# Patient Record
Sex: Male | Born: 1980 | Race: White | Hispanic: No | Marital: Married | Smoking: Former smoker
Health system: Southern US, Community
[De-identification: ages and names within clinical notes are randomized; demographics above are authoritative.]

## PROBLEM LIST (undated history)

## (undated) DIAGNOSIS — G5601 Carpal tunnel syndrome, right upper limb: Secondary | ICD-10-CM

## (undated) HISTORY — PX: VASECTOMY: SHX75

## (undated) HISTORY — PX: WISDOM TOOTH EXTRACTION: SHX21

## (undated) HISTORY — PX: APPENDECTOMY: SHX54

## (undated) HISTORY — PX: TONSILLECTOMY: SUR1361

---

## 1998-05-14 ENCOUNTER — Emergency Department (HOSPITAL_COMMUNITY): Admission: EM | Admit: 1998-05-14 | Discharge: 1998-05-14 | Payer: Self-pay | Admitting: Emergency Medicine

## 1998-05-14 ENCOUNTER — Encounter: Payer: Self-pay | Admitting: Emergency Medicine

## 1998-06-18 ENCOUNTER — Emergency Department (HOSPITAL_COMMUNITY): Admission: EM | Admit: 1998-06-18 | Discharge: 1998-06-18 | Payer: Self-pay | Admitting: Emergency Medicine

## 1998-06-18 ENCOUNTER — Encounter: Payer: Self-pay | Admitting: Emergency Medicine

## 2005-06-02 ENCOUNTER — Emergency Department (HOSPITAL_COMMUNITY): Admission: EM | Admit: 2005-06-02 | Discharge: 2005-06-02 | Payer: Self-pay | Admitting: Emergency Medicine

## 2006-09-17 ENCOUNTER — Emergency Department: Payer: Self-pay | Admitting: Emergency Medicine

## 2006-09-17 ENCOUNTER — Other Ambulatory Visit: Payer: Self-pay

## 2007-09-01 ENCOUNTER — Ambulatory Visit: Payer: Self-pay | Admitting: Family Medicine

## 2007-09-01 DIAGNOSIS — M719 Bursopathy, unspecified: Secondary | ICD-10-CM

## 2007-09-01 DIAGNOSIS — M545 Low back pain, unspecified: Secondary | ICD-10-CM | POA: Insufficient documentation

## 2007-09-01 DIAGNOSIS — M67919 Unspecified disorder of synovium and tendon, unspecified shoulder: Secondary | ICD-10-CM | POA: Insufficient documentation

## 2007-09-05 ENCOUNTER — Ambulatory Visit: Payer: Self-pay | Admitting: Family Medicine

## 2007-09-08 ENCOUNTER — Telehealth: Payer: Self-pay | Admitting: Family Medicine

## 2007-09-12 LAB — CONVERTED CEMR LAB
ALT: 37 units/L (ref 0–53)
AST: 26 units/L (ref 0–37)
Albumin: 4.2 g/dL (ref 3.5–5.2)
Alkaline Phosphatase: 57 units/L (ref 39–117)
BUN: 13 mg/dL (ref 6–23)
Bilirubin, Direct: 0.1 mg/dL (ref 0.0–0.3)
CO2: 30 meq/L (ref 19–32)
Calcium: 9.3 mg/dL (ref 8.4–10.5)
Chloride: 105 meq/L (ref 96–112)
Cholesterol: 149 mg/dL (ref 0–200)
Creatinine, Ser: 1.1 mg/dL (ref 0.4–1.5)
GFR calc Af Amer: 103 mL/min
GFR calc non Af Amer: 85 mL/min
Glucose, Bld: 86 mg/dL (ref 70–99)
HDL: 49.1 mg/dL (ref >39.0)
LDL Cholesterol: 75 mg/dL (ref 0–99)
Potassium: 4.2 meq/L (ref 3.5–5.1)
Sodium: 139 meq/L (ref 135–145)
Total Bilirubin: 0.9 mg/dL (ref 0.3–1.2)
Total CHOL/HDL Ratio: 3
Total Protein: 6.7 g/dL (ref 6.0–8.3)
Triglycerides: 127 mg/dL (ref 0–149)
VLDL: 25 mg/dL (ref 0–40)

## 2007-09-15 ENCOUNTER — Ambulatory Visit: Payer: Self-pay | Admitting: Family Medicine

## 2007-09-15 DIAGNOSIS — K219 Gastro-esophageal reflux disease without esophagitis: Secondary | ICD-10-CM | POA: Insufficient documentation

## 2007-09-25 DIAGNOSIS — F331 Major depressive disorder, recurrent, moderate: Secondary | ICD-10-CM | POA: Insufficient documentation

## 2007-10-12 ENCOUNTER — Telehealth: Payer: Self-pay | Admitting: Family Medicine

## 2007-10-31 ENCOUNTER — Ambulatory Visit: Payer: Self-pay | Admitting: Family Medicine

## 2007-10-31 LAB — CONVERTED CEMR LAB: Rapid Strep: NEGATIVE

## 2007-11-27 ENCOUNTER — Ambulatory Visit: Payer: Self-pay | Admitting: Family Medicine

## 2007-12-28 ENCOUNTER — Ambulatory Visit: Payer: Self-pay | Admitting: Family Medicine

## 2008-03-06 ENCOUNTER — Ambulatory Visit: Payer: Self-pay | Admitting: Family Medicine

## 2008-03-14 ENCOUNTER — Ambulatory Visit: Payer: Self-pay | Admitting: Family Medicine

## 2008-03-22 ENCOUNTER — Telehealth: Payer: Self-pay | Admitting: Family Medicine

## 2008-03-22 ENCOUNTER — Encounter: Payer: Self-pay | Admitting: Family Medicine

## 2009-10-01 ENCOUNTER — Ambulatory Visit: Payer: Self-pay | Admitting: Family Medicine

## 2009-11-07 ENCOUNTER — Telehealth: Payer: Self-pay | Admitting: Family Medicine

## 2010-01-06 ENCOUNTER — Ambulatory Visit: Payer: Self-pay | Admitting: Family Medicine

## 2010-01-06 DIAGNOSIS — R51 Headache: Secondary | ICD-10-CM | POA: Insufficient documentation

## 2010-01-06 DIAGNOSIS — R519 Headache, unspecified: Secondary | ICD-10-CM | POA: Insufficient documentation

## 2010-01-06 DIAGNOSIS — J309 Allergic rhinitis, unspecified: Secondary | ICD-10-CM | POA: Insufficient documentation

## 2010-02-26 ENCOUNTER — Telehealth: Payer: Self-pay | Admitting: Family Medicine

## 2010-03-06 ENCOUNTER — Telehealth: Payer: Self-pay | Admitting: Family Medicine

## 2010-03-11 ENCOUNTER — Encounter: Payer: Self-pay | Admitting: Family Medicine

## 2010-03-11 ENCOUNTER — Telehealth: Payer: Self-pay | Admitting: Family Medicine

## 2010-03-12 ENCOUNTER — Encounter: Payer: Self-pay | Admitting: Family Medicine

## 2010-07-28 NOTE — Assessment & Plan Note (Signed)
Summary: HA/CLE   Vital Signs:  Patient profile:   30 year old male Height:      67.75 inches Weight:      181.8 pounds BMI:     27.95 Temp:     98.5 degrees F oral Pulse rate:   64 / minute Pulse rhythm:   regular BP sitting:   110 / 72  (left arm) Cuff size:   regular  Vitals Entered By: Benny Lennert CMA Duncan Dull) (January 06, 2010 10:21 AM)  History of Present Illness: Chief complaint head ache for 1 month  1 month  frontal headache... intermittant.  Starts in AM... gradually worseing. Nausea, photophonophobia. Funny smells. No numbness, no weakness 8/10 Occ lightheaded. Some runny/congestion... clear nasal discharge. Some sneeze, itchy eyes.  No fever.  Given antibipotics 2-3 months ago and agin 1 month ago...better at that time.. ie less green mucus.   Using fexofenadine... minimal improvement.  Sister: migraine      Problems Prior to Update: 1)  Gerd  (ICD-530.81) 2)  Health Maintenance Exam  (ICD-V70.0) 3)  Allergic Rhinitis Cause Unspecified  (ICD-477.9) 4)  Low Back Pain Syndrome  (ICD-724.2) 5)  Rotator Cuff Syndrome, Left  (ICD-726.10) 6)  Depression  (ICD-311) 7)  Anxiety/anger  (ICD-300.00)  Current Medications (verified): 1)  Multivitamins   Tabs (Multiple Vitamin) .... Take 1 Tablet By Mouth Once A Day 2)  Fluticasone Propionate 50 Mcg/act Susp (Fluticasone Propionate) .... 2 Sprays Per Nostril Daily  Allergies: 1)  ! Pcn 2)  ! Sulfa  Past History:  Past medical, surgical, family and social histories (including risk factors) reviewed, and no changes noted (except as noted below).  Past Medical History: Reviewed history from 09/01/2007 and no changes required. Anxiety Depression  Past Surgical History: Reviewed history from 09/25/2007 and no changes required. Tonsillectomy 8yoa Vasectomy Hetty Ely) 01/2001  Family History: Reviewed history from 09/25/2007 and no changes required. Father A 12s DVTs Mother A 25s depression Brother  A Sister A PGM: DM PGF: prostate cancer MGF: MI at age late 48s  Social History: Reviewed history from 09/01/2007 and no changes required. 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 CV:  Denies fainting and swelling of feet. Resp:  mild shortness of breath due to nose congested. GI:  Denies abdominal pain. GU:  Denies dysuria.  Physical Exam  General:  Well-developed,well-nourished,in no acute distress; alert,appropriate and cooperative throughout examination Head:  no maxillary sinus ttp Eyes:  No corneal or conjunctival inflammation noted. EOMI. Perrla. Funduscopic exam benign, without hemorrhages, exudates or papilledema. Vision grossly normal. Ears:  External ear exam shows no significant lesions or deformities.  Otoscopic examination reveals clear canals, tympanic membranes are intact bilaterally without bulging, retraction, inflammation or discharge. Hearing is grossly normal bilaterally. Nose:  External nasal examination shows no deformity or inflammation. Nasal mucosa are pink and moist without lesions or exudates. Mouth:  Oral mucosa and oropharynx without lesions or exudates.  Teeth in good repair. Neck:  no carotid bruit or thyromegaly no cervical or supraclavicular lymphadenopathy  Lungs:  Normal respiratory effort, chest expands symmetrically. Lungs are clear to auscultation, no crackles or wheezes. Heart:  Normal rate and regular rhythm. S1 and S2 normal without gallop, murmur, click, rub or other extra sounds. Neurologic:  No cranial nerve deficits noted. Station and gait are normal. DTRs are  symmetrical throughout. Sensory, motor and coordinative functions appear intact.   Impression & Recommendations:  Problem # 1:  HEADACHE (ICD-784.0) Possible migrane. info given on  migraine and healthy lifestyle. Keep headhace diary.  Treat allergies as possible trigger as below.  The following medications were removed from the medication list:    Mobic 15 Mg Tabs (Meloxicam) .Marland Kitchen... 1 by mouth once daily with food as needed back pain  Problem # 2:  ALLERGIC RHINITIS (ICD-477.9) Continue allegra, add nasal steroid.  His updated medication list for this problem includes:    Fluticasone Propionate 50 Mcg/act Susp (Fluticasone propionate) .Marland Kitchen... 2 sprays per nostril daily  Complete Medication List: 1)  Multivitamins Tabs (Multiple vitamin) .... Take 1 tablet by mouth once a day 2)  Fluticasone Propionate 50 Mcg/act Susp (Fluticasone propionate) .... 2 sprays per nostril daily  Patient Instructions: 1)  8 hours sleep at night, increase fluids (64 oz water a day), 3 meals a day, regular exercise. 2)   Keep headache diary..Review migraine info. 3)  Continue fexofenadine. 4)  Add nasal steroid. 5)   For headache ibuprofen 800 mg every three hours for headache, or aleve.  6)  Follow up in 3-4 weeks if symptoms not improving.  Prescriptions: FLUTICASONE PROPIONATE 50 MCG/ACT SUSP (FLUTICASONE PROPIONATE) 2 sprays per nostril daily  #1 x 2   Entered and Authorized by:   Kerby Nora MD   Signed by:   Kerby Nora MD on 01/06/2010   Method used:   [facility]        [facility] [facility]       [address]       [place], [place]  [address]       Ph: [phone] or [phone]       Fax: [phone]   RxID:   [id number]   Current Allergies (reviewed today): ! PCN ! SULFA

## 2010-07-28 NOTE — Medication Information (Signed)
Summary: Prior Authorization Request for Singulair  Prior Authorization Request for Singulair   Imported By: Maryln Gottron 03/27/2010 11:21:27  _____________________________________________________________________  External Attachment:    Type:   Image     Comment:   External Document

## 2010-07-28 NOTE — Progress Notes (Signed)
Summary: prior auth needed for singulair  Phone Note From Pharmacy   [facility] [address] [employer] [facility] Call: Prior Berkley Harvey is needed for singulair, form is on your desk.  Initial call taken by: Lowella Petties CMA,  March 11, 2010 8:50 AM
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
## 2010-07-28 NOTE — Progress Notes (Signed)
Summary: Cyclobenzaprine 10mg  rx  Phone Note Refill Request Call back at [phone] Message from:  [facility] on Nov 07, 2009 2:03 PM  Refills Requested: Medication #1:  FLEXERIL 10 MG TABS 1 by mouth up to three times a day as needed back pain warn of sedation. [facility]ill on Flexeril 10mg   No refill date sent.Please advise.    Method Requested: Telephone to Pharmacy Initial call taken by: Lewanda Rife LPN,  Nov 07, 2009 2:04 PM  Follow-up for Phone Call        Is Dr Ermalene Searing here today? Follow-up by: Judith Part MD,  Nov 07, 2009 2:13 PM  Additional Follow-up for Phone Call Additional follow up Details #1::        Refill x 1...but if back pain not improving will need follow up appt .  Additional Follow-up by: Kerby Nora MD,  Nov 07, 2009 3:26 PM    Prescriptions: FLEXERIL 10 MG TABS (CYCLOBENZAPRINE HCL) 1 by mouth up to three times a day as needed back pain warn of sedation  #30 x 0   Entered and Authorized by:   Kerby Nora MD   Signed by:   Kerby Nora MD on 11/07/2009   Method used:   Telephoned to ...         RxID:   [id number]

## 2010-07-28 NOTE — Progress Notes (Signed)
Summary: having headaches   Phone Note Call from Patient Call back at [phone]   Caller: Patient Call For: Kerby Nora MD Summary of Call: Patient was using nasal spray for about a month and then he started having nose bleeds. Patient was told to stop using the nasal spray and he has not used it for about a week now and his headaches are back. He is asking if there is something else he can try to get rid of the headaches. [facility]  Initial call taken by: Melody Comas,  March 06, 2010 3:12 PM  Follow-up for Phone Call        Should hold nasla steroid sprays for a whil longer...ie 3-4 weeks.  We can try trial of other allergy med instead...is he taking zyrtec or allegra?  Follow-up by: Kerby Nora MD,  March 06, 2010 3:21 PM  Additional Follow-up for Phone Call Additional follow up Details #1::        Advised pt, he is taking allegra every day, he doesnt think this is helping.  [facility]    Additional Follow-up for Phone Call Additional follow up Details #2::    Add singulair. Continue allegra. Follow-up by: Kerby Nora MD,  March 06, 2010 4:00 PM  Additional Follow-up for Phone Call Additional follow up Details #3:: Details for Additional Follow-up Action Taken: Advised pt. Additional Follow-up by: Lowella Petties CMA,  March 06, 2010 4:18 PM  New/Updated Medications: FEXOFENADINE HCL 180 MG TABS (FEXOFENADINE HCL) Take 1 tablet by mouth once a day SINGULAIR 10 MG TABS (MONTELUKAST SODIUM) 1 tab by mouth daily Prescriptions: SINGULAIR 10 MG TABS (MONTELUKAST SODIUM) 1 tab by mouth daily  #30 x 3   Entered and Authorized by:   Kerby Nora MD   Signed by:   Kerby Nora MD on 03/06/2010   Method used:   [facility]        [facility] ([facility]ail)       [address]       [place], [place]  [address]       Ph: [phone] or [phone]       Fax: [phone]   RxID:   [id number]

## 2010-07-28 NOTE — Progress Notes (Signed)
Summary: regarding flonase and nose bleeds  Phone Note Call from Patient Call back at Home Phone [phone]   Caller: Patient Summary of Call: Pt was given flonase for sinus problems about a month and a half ago- he was having a lot of headaches and this helped.  He says for the past week or so he has been having nose bleeds- he has a hx of nose bleeds but not for quite a while.  He has had 3 or 4 in the past week or two.  He is asking if the flonase could have caused these.  Should he get a refill on this or change to something else.  [facility] Initial call taken by: Lowella Petties CMA,  February 26, 2010 3:03 PM  Follow-up for Phone Call        yes  d/c all nasal steroids will have to be off of them for a while  make sure he is on claritin, zyrtec, or other allergy meds (i think on allegra) can revisit nasal steroid question in a couple of months Follow-up by: Hannah Beat MD,  February 26, 2010 4:05 PM  Additional Follow-up for Phone Call Additional follow up Details #1::        Patient advised via telephone spoke directly to patient Additional Follow-up by: Benny Lennert CMA (AAMA),  February 26, 2010 4:20 PM

## 2010-07-28 NOTE — Medication Information (Signed)
Summary: Approval for Singulair/[employer]  Approval for Singulair/[employer]   Imported By: Lanelle Bal 03/20/2010 10:49:19  _____________________________________________________________________  External Attachment:    Type:   Image     Comment:   External Document

## 2011-01-22 ENCOUNTER — Encounter: Payer: Self-pay | Admitting: Family Medicine

## 2011-02-04 ENCOUNTER — Encounter: Payer: Self-pay | Admitting: Family Medicine

## 2011-02-05 ENCOUNTER — Encounter: Payer: Self-pay | Admitting: Family Medicine

## 2011-02-05 ENCOUNTER — Ambulatory Visit (INDEPENDENT_AMBULATORY_CARE_PROVIDER_SITE_OTHER): Payer: BC Managed Care – PPO | Admitting: Family Medicine

## 2011-02-05 VITALS — BP 120/74 | HR 61 | Temp 97.6°F | Ht 68.25 in | Wt 185.8 lb

## 2011-02-05 DIAGNOSIS — K3189 Other diseases of stomach and duodenum: Secondary | ICD-10-CM

## 2011-02-05 DIAGNOSIS — R1013 Epigastric pain: Secondary | ICD-10-CM

## 2011-02-05 DIAGNOSIS — R109 Unspecified abdominal pain: Secondary | ICD-10-CM

## 2011-02-05 LAB — CBC WITH DIFFERENTIAL/PLATELET
Basophils Absolute: 0 10*3/uL (ref 0.0–0.1)
Eosinophils Absolute: 0.2 10*3/uL (ref 0.0–0.7)
HCT: 42.9 % (ref 39.0–52.0)
Hemoglobin: 14.4 g/dL (ref 13.0–17.0)
Lymphs Abs: 1.6 10*3/uL (ref 0.7–4.0)
MCHC: 33.6 g/dL (ref 30.0–36.0)
Neutro Abs: 2.2 10*3/uL (ref 1.4–7.7)
RDW: 13.4 % (ref 11.5–14.6)

## 2011-02-05 NOTE — Patient Instructions (Signed)
PUD and Dyspepsia  1. Treat H. Pylori if present 2. Stop aspirin and NSAID use 3. If mild, OTC Zantac or Pepcid can help, treat 6-12 weeks 4. If severe, prescription likely needed, 3-6 months 5. Smoking, alcohol, caffeine can make worse 6. Don't eat foods that make you worse

## 2011-02-05 NOTE — Progress Notes (Signed)
  Subjective:    Patient ID: Christopher Hatfield, male    DOB: 10-02-1980, 30 y.o.   MRN: [id number]  HPI  Christopher Hatfield, a 30 y.o. male presents today in the office for the following:    Stomache has been hurting, feeling like a swelling pain in his stomache. Then it wil go away. The other day, after he ate, it was alll through his sides.   Glucosamine - started last week.   Last Saturday. Woke up. And his wife eating some granola bars with oats and honey. Last week, made some with bananas.  All week -- feels like nothing is really motving. The next day it was about the same thing. Took some epsom salts and water and got cleaned out.  --Feeling better this morning  More meat and veggies before then.   Felt a bloating sensation in the stomach yesterday after eating.  No blood or melena in stool Passing gas  The PMH, PSH, Social History, Family History, Medications, and allergies have been reviewed in [facility], and have been updated if relevant.  Review of Systems ROS: GEN: No acute illnesses, no fevers, chills. GI: above Pulm: No SOB Interactive and getting along well at home.  Otherwise, ROS is as per the HPI.     Objective:   Physical Exam   Physical Exam  Blood pressure 120/74, pulse 61, temperature 97.6 F (36.4 C), temperature source Oral, height 5' 8.25" (1.734 m), weight 185 lb 12.8 oz (84.278 kg), SpO2 99.00%.  GEN: WDWN, NAD, Non-toxic, A & O x 3 HEENT: Atraumatic, Normocephalic. Neck supple. No masses, No LAD. Ears and Nose: No external deformity. CV: RRR, No M/G/R. No JVD. No thrill. No extra heart sounds. PULM: CTA B, no wheezes, crackles, rhonchi. No retractions. No resp. distress. No accessory muscle use. ABD: S, mildly tender R LQ, ND, +BS. No rebound tenderness. No HSM. Able to jump up and down easily. EXTR: No c/c/e NEURO Normal gait.  PSYCH: Normally interactive. Appears mildly anxious.     Assessment & Plan:   1. Abdominal  pain, other specified site   H. pylori antibody, IgG, CBC with Differential  2. Dyspepsia     Overall, I think constipated some and stomache irritation most likely from recent diet changes. Improving after good BM. Would alter diet - discussed. Reasonable to check H. Pylori and CBC - if pos, more ominous problem could be possible.   Discussed that if acute abd pain and worsens over the weekend, to seek medical care.  Refer to the patient instructions sections for details of plan shared with patient.

## 2013-03-13 ENCOUNTER — Ambulatory Visit (INDEPENDENT_AMBULATORY_CARE_PROVIDER_SITE_OTHER): Payer: BC Managed Care – PPO | Admitting: Family Medicine

## 2013-03-13 ENCOUNTER — Encounter: Payer: Self-pay | Admitting: Family Medicine

## 2013-03-13 VITALS — BP 106/78 | HR 55 | Temp 98.0°F | Ht 68.25 in | Wt 192.5 lb

## 2013-03-13 DIAGNOSIS — F411 Generalized anxiety disorder: Secondary | ICD-10-CM | POA: Insufficient documentation

## 2013-03-13 DIAGNOSIS — R51 Headache: Secondary | ICD-10-CM

## 2013-03-13 DIAGNOSIS — R5381 Other malaise: Secondary | ICD-10-CM | POA: Insufficient documentation

## 2013-03-13 DIAGNOSIS — R300 Dysuria: Secondary | ICD-10-CM

## 2013-03-13 DIAGNOSIS — Z13220 Encounter for screening for lipoid disorders: Secondary | ICD-10-CM

## 2013-03-13 LAB — POCT URINALYSIS DIPSTICK
Bilirubin, UA: NEGATIVE
Blood, UA: NEGATIVE
Nitrite, UA: NEGATIVE
Spec Grav, UA: <=1.005
Urobilinogen, UA: 0.2
pH, UA: 8

## 2013-03-13 NOTE — Assessment & Plan Note (Addendum)
Eval with UA. No sign of infection or hematuria.

## 2013-03-13 NOTE — Assessment & Plan Note (Signed)
Eval with labs. 

## 2013-03-13 NOTE — Progress Notes (Signed)
  Subjective:    Patient ID: Christopher Hatfield, male    DOB: 1980-11-12, 32 y.o.   MRN: [id number]  HPI   32 year old male pt of mine not seen by me in several years.   He reports that he has become more overwhelmed in last week. He has been more overwhelmed and anxious feeling since 09/2012, but much worse in last week.  Father told him last weekend that his mother is ill, not getting better. Wife also ill about other things.   He is under a lot of stress. Has been working long work hours. He feels like he could explode. He feels very anxious, angry easily. Over reacts to small things. He feels he is sad about his Mom but only in last week. Good sleep at night... Still feels tired after 9 hours of sleep... Ongoing fatigue for 9 months. No SI, no HI. He has episodes of feeling jittery.  He feels like food not moving through. Feels abdominal fullness. Does not have regular BMs, once he does he feels relieved.  Headaches from sinuses.  Injured elbow in last few weeks has been aching him. Occ hears grinding when extending fully.  Occ using tramadol or ibuprofen for pain.  He feels some urinary frequency, occ burning with urination. Once he is done he feels like he has to return immediately. Occ intermittently in last 1-2 months. No fever.     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  Father with prostate cancer age 56.    Review of Systems  Constitutional: Positive for fatigue. Negative for fever.  HENT: Negative for neck pain.   Eyes: Negative for pain.  Respiratory: Positive for shortness of breath. Negative for cough and wheezing.   Cardiovascular: Positive for palpitations. Negative for chest pain and leg swelling.  Gastrointestinal: Positive for abdominal pain.  Neurological: Negative for syncope.  Psychiatric/Behavioral: Positive for agitation. Negative for confusion.       Objective:   Physical Exam  Constitutional: Vital signs are normal. He appears  well-developed and well-nourished.  HENT:  Head: Normocephalic.  Right Ear: Hearing normal.  Left Ear: Hearing normal.  Nose: Nose normal.  Mouth/Throat: Oropharynx is clear and moist and mucous membranes are normal.  Neck: Trachea normal. Carotid bruit is not present. No mass and no thyromegaly present.  Cardiovascular: Normal rate, regular rhythm and normal pulses.  Exam reveals no gallop, no distant heart sounds and no friction rub.   No murmur heard. No peripheral edema  Pulmonary/Chest: Effort normal and breath sounds normal. No respiratory distress.  Skin: Skin is warm, dry and intact. No rash noted.  Psychiatric: He has a normal mood and affect. His speech is normal and behavior is normal. Judgment and thought content normal. His mood appears not anxious. Cognition and memory are normal.          Assessment & Plan:

## 2013-03-13 NOTE — Assessment & Plan Note (Signed)
Will evaluate with labs for secondary cause like thyroid. Likely will need medication for treatment.

## 2013-03-13 NOTE — Patient Instructions (Addendum)
Return with fasting labs. We will call with lab results.

## 2013-03-14 ENCOUNTER — Other Ambulatory Visit (INDEPENDENT_AMBULATORY_CARE_PROVIDER_SITE_OTHER): Payer: BC Managed Care – PPO

## 2013-03-14 DIAGNOSIS — F411 Generalized anxiety disorder: Secondary | ICD-10-CM

## 2013-03-14 DIAGNOSIS — Z13220 Encounter for screening for lipoid disorders: Secondary | ICD-10-CM

## 2013-03-14 DIAGNOSIS — R5381 Other malaise: Secondary | ICD-10-CM

## 2013-03-14 LAB — COMPREHENSIVE METABOLIC PANEL
ALT: 16 U/L (ref 0–53)
BUN: 11 mg/dL (ref 6–23)
CO2: 29 mEq/L (ref 19–32)
Calcium: 9.3 mg/dL (ref 8.4–10.5)
Chloride: 105 mEq/L (ref 96–112)
Creatinine, Ser: 0.9 mg/dL (ref 0.4–1.5)
GFR: 101.08 mL/min (ref >60.00)
Total Bilirubin: 0.7 mg/dL (ref 0.3–1.2)

## 2013-03-14 LAB — CBC WITH DIFFERENTIAL/PLATELET
Basophils Relative: 0.7 % (ref 0.0–3.0)
Eosinophils Absolute: 0.4 10*3/uL (ref 0.0–0.7)
Eosinophils Relative: 8.4 % — ABNORMAL HIGH (ref 0.0–5.0)
Hemoglobin: 14.1 g/dL (ref 13.0–17.0)
Lymphocytes Relative: 40 % (ref 12.0–46.0)
Monocytes Relative: 8.3 % (ref 3.0–12.0)
Neutro Abs: 1.9 10*3/uL (ref 1.4–7.7)
Neutrophils Relative %: 42.6 % — ABNORMAL LOW (ref 43.0–77.0)
RBC: 4.8 Mil/uL (ref 4.22–5.81)
WBC: 4.4 10*3/uL — ABNORMAL LOW (ref 4.5–10.5)

## 2013-03-14 LAB — LIPID PANEL
HDL: 64.2 mg/dL (ref >39.00)
LDL Cholesterol: 60 mg/dL (ref 0–99)
Total CHOL/HDL Ratio: 2

## 2013-03-14 LAB — TSH: TSH: 1.34 u[IU]/mL (ref 0.35–5.50)

## 2013-03-16 ENCOUNTER — Encounter: Payer: Self-pay | Admitting: *Deleted

## 2013-03-19 ENCOUNTER — Telehealth: Payer: Self-pay

## 2013-03-19 NOTE — Telephone Encounter (Signed)
Pt request recent lab results; pt notified as instructed with result notes and advised pt letter to follow with values. Pt voiced understanding. (unable to go into done result notes).

## 2013-05-03 ENCOUNTER — Other Ambulatory Visit: Payer: Self-pay

## 2013-05-03 ENCOUNTER — Encounter: Payer: Self-pay | Admitting: Family Medicine

## 2013-05-03 ENCOUNTER — Ambulatory Visit (INDEPENDENT_AMBULATORY_CARE_PROVIDER_SITE_OTHER): Payer: BC Managed Care – PPO | Admitting: Family Medicine

## 2013-05-03 VITALS — BP 110/76 | HR 61 | Temp 98.3°F | Ht 68.25 in | Wt 194.5 lb

## 2013-05-03 DIAGNOSIS — G5620 Lesion of ulnar nerve, unspecified upper limb: Secondary | ICD-10-CM

## 2013-05-03 DIAGNOSIS — G5622 Lesion of ulnar nerve, left upper limb: Secondary | ICD-10-CM | POA: Insufficient documentation

## 2013-05-03 MED ORDER — DICLOFENAC SODIUM 75 MG PO TBEC: 75.0000 mg | DELAYED_RELEASE_TABLET | Freq: Two times a day (BID) | ORAL | Status: DC

## 2013-05-03 NOTE — Progress Notes (Signed)
Pre-visit discussion using our clinic review tool. No additional management support is needed unless otherwise documented below in the visit note.  

## 2013-05-03 NOTE — Assessment & Plan Note (Signed)
Limit mpotion, avoid compression, NSAIDs for possible tennis elbow component. Follow up if not improving in 2 weeks for consideration of nerve conduction.  No clear sign of cervical radiculopathy.

## 2013-05-03 NOTE — Patient Instructions (Signed)
Start diclofenac twice daily. Change position at work to avoid compression at wrist and elbow. The use of a soft foam elbow pad may reduce inadvertent compression of the ulnar nerve at the elbow. We suggest that patients wrap the affected elbow with a towel at night to limit flexion, since a towel wrap is usually better tolerated than more rigid braces. Follow up if not improving in 2 weeks. Call sooner if neck pain.

## 2013-05-03 NOTE — Progress Notes (Signed)
  Subjective:    Patient ID: Christopher Hatfield, male    DOB: 06-04-1981, 32 y.o.   MRN: [id number]  HPI  32 year old male presents with new onset 1 week on left arm numbness... Wakes up at night with left arm numb/pins and needles and achy. Elbow to fingertips initially.. Improves with shaking hands out.  Some pain now from shoulder to hand. Daytime numbness noted in ulnar side of hand.   No pain change with neck movement.  occ low back pain, no new neck pain.  He has been chopping trees down a lot and moving wood. No typing.. 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 Left elbow on arm rest when driving.  Had episode of tenderness inlef telbow after fully extending it 1 month ago... Since then pain over lateral epicondyle.   Review of Systems  Constitutional: Negative for fever and fatigue.  HENT: Negative for congestion.   Respiratory: Negative for cough and shortness of breath.   Cardiovascular: Negative for chest pain.  Gastrointestinal: Negative for abdominal pain.       Objective:   Physical Exam  Constitutional: He appears well-developed and well-nourished.  Neck: Normal range of motion. Neck supple. No thyromegaly present.  Neg spurlings  Cardiovascular: Normal rate.   No murmur heard. Pulmonary/Chest: Effort normal and breath sounds normal.  Musculoskeletal:       Left elbow: He exhibits normal range of motion, no swelling and no effusion. Tenderness found. Lateral epicondyle tenderness noted.       Right forearm: Normal. He exhibits no tenderness.  Neg tinel and phalen, neg ulnar nerve compression  Neurological: He has normal strength. He displays no atrophy. No cranial nerve deficit or sensory deficit. He exhibits normal muscle tone.          Assessment & Plan:

## 2013-05-09 ENCOUNTER — Telehealth: Payer: Self-pay

## 2013-05-09 DIAGNOSIS — G5622 Lesion of ulnar nerve, left upper limb: Secondary | ICD-10-CM

## 2013-05-09 NOTE — Telephone Encounter (Signed)
Pt was seen 05/03/13 and pt is wearing wrist and elbow brace, taking prescribed med but pain and numbness is no better. No neck pain. Pt request med to help pain at nighttime; when pt gets still at night notices pain and numbness from shoulder to hand more and difficult to sleep all night. Pt said difficult to come for appt due to work and pt request med sent to [facility].Please advise.Pt request cb.

## 2013-05-09 NOTE — Telephone Encounter (Signed)
Advised Christopher Hatfield that Dr. Ermalene Searing will not be back in the office until Friday.

## 2013-05-10 MED ORDER — TRAMADOL HCL 50 MG PO TABS: 50.0000 mg | ORAL_TABLET | Freq: Three times a day (TID) | ORAL | Status: DC | PRN

## 2013-05-10 NOTE — Telephone Encounter (Signed)
Tramadol called to [facility]  Herbie notified as instructed by telephone.  Will await Marion's call with the nerve conduction study appointment.

## 2013-05-10 NOTE — Telephone Encounter (Signed)
We will start tramadol for pain and I will set him up for a nerve conduction test.

## 2013-05-27 ENCOUNTER — Other Ambulatory Visit: Payer: Self-pay | Admitting: Family Medicine

## 2013-05-28 NOTE — Telephone Encounter (Signed)
Last office visit 05/03/2013. Diclofenac not on current med list.  Ok to refill?

## 2013-05-29 NOTE — Telephone Encounter (Signed)
Tramadol called to [facility] [facility]

## 2013-06-01 ENCOUNTER — Ambulatory Visit (INDEPENDENT_AMBULATORY_CARE_PROVIDER_SITE_OTHER): Payer: BC Managed Care – PPO | Admitting: Family Medicine

## 2013-06-01 ENCOUNTER — Encounter: Payer: Self-pay | Admitting: Family Medicine

## 2013-06-01 VITALS — BP 110/76 | HR 55 | Temp 98.1°F | Ht 68.25 in | Wt 194.2 lb

## 2013-06-01 DIAGNOSIS — M79609 Pain in unspecified limb: Secondary | ICD-10-CM

## 2013-06-01 DIAGNOSIS — G8929 Other chronic pain: Secondary | ICD-10-CM | POA: Insufficient documentation

## 2013-06-01 DIAGNOSIS — G5622 Lesion of ulnar nerve, left upper limb: Secondary | ICD-10-CM

## 2013-06-01 DIAGNOSIS — M79602 Pain in left arm: Secondary | ICD-10-CM

## 2013-06-01 DIAGNOSIS — G5620 Lesion of ulnar nerve, unspecified upper limb: Secondary | ICD-10-CM

## 2013-06-01 MED ORDER — CYCLOBENZAPRINE HCL 10 MG PO TABS: 10.0000 mg | ORAL_TABLET | Freq: Every evening | ORAL | Status: DC | PRN

## 2013-06-01 NOTE — Assessment & Plan Note (Signed)
At this point there is more question of neck source of pain. Will treat with flexeril at bedtime. Move ahead with nerve conduction.

## 2013-06-01 NOTE — Progress Notes (Signed)
Pre-visit discussion using our clinic review tool. No additional management support is needed unless otherwise documented below in the visit note.  

## 2013-06-01 NOTE — Patient Instructions (Signed)
Stop front desk to talk with Shirlee Limerick abut the nerve conduction test. Start flexeril 10 mg at bedtime.

## 2013-06-01 NOTE — Progress Notes (Signed)
   Subjective:    Patient ID: Christopher Hatfield, male    DOB: 02-Apr-1981, 32 y.o.   MRN: [id number]  HPI  32 year old male  presents for 1 month follow up of left arm numbness... At last OV I felt it was due to ulnar neuropathy... recs at that time were :  Start diclofenac twice daily.  Change position at work to avoid compression at wrist and elbow.  The use of a soft foam elbow pad may reduce inadvertent compression of the ulnar nerve at the elbow. We suggest that patients wrap the affected elbow with a towel at night to limit flexion, since a towel wrap is usually better tolerated than more rigid braces.   He returns to clinic today with  no improvement in  left forearm pain, waking him up at ngiht. He states NSAIDs and brace for elbow and wrist did not help.  Now feels more like a cramp in left arm. Still having tingling in left hand off and on.. All fingers but most in left  Had episode of left shoulder pain, popped then arm felt better. Has history of  Left shoulder pain, treated with steroids in past. No neck pain. No pain in arm when moves head. One noght he woke up with head turned and all the pain went away. Cannot sleep due to pain.  Tried muscle relaxant  (old med) and tramadol  for pain helped some.    Review of Systems  Constitutional: Negative for fever, fatigue and unexpected weight change.  HENT: Negative for congestion, ear pain, postnasal drip, rhinorrhea, sore throat and trouble swallowing.   Eyes: Negative for pain.  Respiratory: Negative for cough, shortness of breath and wheezing.   Cardiovascular: Negative for chest pain, palpitations and leg swelling.  Gastrointestinal: Negative for nausea, abdominal pain, diarrhea, constipation and blood in stool.  Skin: Negative for rash.  Neurological: Negative for syncope, weakness, light-headedness, numbness and headaches.  Psychiatric/Behavioral: Negative for behavioral problems and dysphoric mood. The patient is not  nervous/anxious.        Objective:   Physical Exam  Constitutional: Vital signs are normal. He appears well-developed and well-nourished.  HENT:  Head: Normocephalic.  Right Ear: Hearing normal.  Left Ear: Hearing normal.  Nose: Nose normal.  Mouth/Throat: Oropharynx is clear and moist and mucous membranes are normal.  Neck: Trachea normal. Carotid bruit is not present. No mass and no thyromegaly present.  Cardiovascular: Normal rate, regular rhythm and normal pulses.  Exam reveals no gallop, no distant heart sounds and no friction rub.   No murmur heard. No peripheral edema  Pulmonary/Chest: Effort normal and breath sounds normal. No respiratory distress.  Musculoskeletal:       Left shoulder: He exhibits normal range of motion, no tenderness and no bony tenderness.       Left elbow: He exhibits normal range of motion, no swelling and no effusion. No tenderness found.       Cervical back: He exhibits tenderness. He exhibits normal range of motion.  forearm tender to palpation mildly Neg Spurling's.  Neurological: He has normal strength. He exhibits normal muscle tone. He displays a negative Romberg sign. Coordination normal.  Skin: Skin is warm, dry and intact. No rash noted.  Psychiatric: He has a normal mood and affect. His speech is normal and behavior is normal. Thought content normal.          Assessment & Plan:

## 2013-06-04 ENCOUNTER — Encounter: Payer: BC Managed Care – PPO | Admitting: Neurology

## 2013-06-16 ENCOUNTER — Other Ambulatory Visit: Payer: Self-pay | Admitting: Family Medicine

## 2013-06-17 NOTE — Telephone Encounter (Signed)
Last office visit 06/01/2013.  Ok to refill? 

## 2013-06-19 NOTE — Telephone Encounter (Signed)
Pt left v/m checking on status of tramadol and cyclobenzaprine refills to [facility]. Pt request cb when med called in for arm pain.

## 2013-06-19 NOTE — Telephone Encounter (Signed)
Tramadol called to [facility].  Christopher Hatfield notified prescriptions have been sent to his pharmacy.

## 2013-07-05 ENCOUNTER — Ambulatory Visit (INDEPENDENT_AMBULATORY_CARE_PROVIDER_SITE_OTHER): Payer: BC Managed Care – PPO | Admitting: Neurology

## 2013-07-05 ENCOUNTER — Encounter: Payer: Self-pay | Admitting: Neurology

## 2013-07-05 DIAGNOSIS — G5602 Carpal tunnel syndrome, left upper limb: Secondary | ICD-10-CM

## 2013-07-05 DIAGNOSIS — G5603 Carpal tunnel syndrome, bilateral upper limbs: Secondary | ICD-10-CM | POA: Insufficient documentation

## 2013-07-05 DIAGNOSIS — G5600 Carpal tunnel syndrome, unspecified upper limb: Secondary | ICD-10-CM

## 2013-07-05 DIAGNOSIS — G5601 Carpal tunnel syndrome, right upper limb: Secondary | ICD-10-CM

## 2013-07-05 NOTE — Progress Notes (Signed)
See procedure note under "Notes" tab for EMG results.  Megyn Leng K. Azarie Coriz, DO  

## 2013-07-05 NOTE — Procedures (Signed)
[facility]  [address], [address]  [place], [place] [address] Tel: [phone] Fax:  [phone] Test Date:  07/05/2013  Patient: Christopher Hatfield DOB: 1980/12/15 Physician: Nita Sickleonika Crystal Ellwood, DO  Sex: Male Height: 5\' 8"  Ref Phys: Kerby NoraBedsole, Amy  ID#: [id number] Temp: 33.6C Technician:    Patient Complaints: This is a 33 year-old gentleman presenting with 4491-month history of paresthesias over his left forearm and hand.  NCV & EMG Findings: Extensive evaluation of the left upper extremity and additional studies of the right reveals: 1. The left median sensory and motor responses are prolonged.   2. Proximal median motor response was increased while the motor amplitude at the wrist was reduced.  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  These findings are suggestive of a median to ulnar anastomosis in the forearm, a normal variant. 3. The right median sensory and motor responses are prolonged with preserved amplitude.   4. Bilateral ulnar sensory and motor responses are normal. 5. Needle electrode examination does not show any active or chronic motor axon loss in the tested muscles.  Impression: 1. Left median neuropathy, at or distal to the wrist, consistent with the clinical diagnosis with carpal tunnel syndrome; moderate in degree electrically.  Of note, there is a left Martin-Gruber anastomosis which is a normal variant. 2. Right median neuropathy, at or distal to the wrist, consistent with the clinical diagnosis with carpal tunnel syndrome; mild in degree electrically. 3. There is no evidence of an ulnar neuropathy or cervical radiculopathy affecting the left side.   ___________________________ Nita Sickleonika Yailin Biederman, DO    Nerve Conduction Studies Anti Sensory Summary Table   Site NR Peak (ms) Norm Peak (ms) P-T Amp (V) Norm P-T Amp  Left Median Anti Sensory (2nd Digit)    Wrist    5.1 <3.4 17.6 >20  Right Median Anti Sensory (2nd Digit)  Wrist    4.0 <3.4 31.1 >20  Left Ulnar Anti Sensory (5th Digit)  Wrist    3.0 <3.1 36.9 >12  Right Ulnar Anti Sensory (5th Digit)  Wrist    3.0 <3.1 42.5 >12   Motor Summary Table   Site NR Onset (ms) Norm Onset (ms) O-P Amp (mV) Norm O-P Amp Site1 Site2 Delta-0 (ms) Dist (cm) Vel (m/s) Norm Vel (m/s)  Left Median Motor (Abd Poll Brev)  Wrist    5.0 <3.9 5.9 >6 Elbow Wrist 5.2 30.0 58 >50  Elbow    10.2  10.1  Ulnar-wrist Elbow 6.2 0.0    Ulnar-wrist    4.0  7.7         Right Median Motor (Abd Poll Brev)  Wrist    4.2 <3.9 13.5 >6 Elbow Wrist 5.6 32.0 57 >50  Elbow    9.8  13.4         Left Ulnar Motor (Abd Dig Minimi)  Wrist    2.5 <3.1 14.0 >7 B Elbow Wrist 4.4 25.0 57 >50  B Elbow    6.9  12.5  A Elbow B Elbow 1.8 10.0 56 >50  A Elbow    8.7  11.9         Right Ulnar Motor (Abd Dig Minimi)  Wrist    2.6 <3.1 12.9 >7 B Elbow Wrist 4.0 25.0 63 >50  B Elbow    6.6  11.6  A Elbow B Elbow 1.3 10.0 77 >50  A Elbow  7.9  11.5         Left Ulnar (FDI) Motor (1st DI)  Wrist    3.9 <4.3 15.9 >7         EMG   Side Muscle Ins Act Fibs Psw Fasc Number Recrt Dur Dur. Amp Amp. Poly Poly. Comment  Left 1stDorInt Nml Nml Nml Nml Nml Nml Nml Nml Nml Nml Nml Nml N/A  Left Abd Poll Brev Nml Nml Nml Nml Nml Nml Nml Nml Nml Nml Nml Nml N/A  Left Triceps Nml Nml Nml Nml Nml Nml Nml Nml Nml Nml Nml Nml N/A  Left Biceps Nml Nml Nml Nml Nml Nml Nml Nml Nml Nml Nml Nml N/A  Left Deltoid Nml Nml Nml Nml Nml Nml Nml Nml Nml Nml Nml Nml N/A  Left FlexPolLong Nml Nml Nml Nml Nml Nml Nml Nml Nml Nml Nml Nml N/A  Left PronatorTeres Nml Nml Nml Nml Nml Nml Nml Nml Nml Nml Nml Nml N/A      Waveforms:

## 2013-07-06 ENCOUNTER — Ambulatory Visit (INDEPENDENT_AMBULATORY_CARE_PROVIDER_SITE_OTHER): Payer: BC Managed Care – PPO | Admitting: Family Medicine

## 2013-07-06 ENCOUNTER — Telehealth: Payer: Self-pay | Admitting: Family Medicine

## 2013-07-06 ENCOUNTER — Encounter: Payer: Self-pay | Admitting: Family Medicine

## 2013-07-06 VITALS — BP 110/80 | HR 76 | Temp 97.8°F | Ht 68.25 in | Wt 198.8 lb

## 2013-07-06 DIAGNOSIS — L03114 Cellulitis of left upper limb: Secondary | ICD-10-CM | POA: Insufficient documentation

## 2013-07-06 DIAGNOSIS — M79609 Pain in unspecified limb: Secondary | ICD-10-CM

## 2013-07-06 DIAGNOSIS — G5603 Carpal tunnel syndrome, bilateral upper limbs: Secondary | ICD-10-CM

## 2013-07-06 DIAGNOSIS — M79602 Pain in left arm: Secondary | ICD-10-CM

## 2013-07-06 DIAGNOSIS — L02519 Cutaneous abscess of unspecified hand: Secondary | ICD-10-CM

## 2013-07-06 DIAGNOSIS — L03119 Cellulitis of unspecified part of limb: Secondary | ICD-10-CM

## 2013-07-06 DIAGNOSIS — G5600 Carpal tunnel syndrome, unspecified upper limb: Secondary | ICD-10-CM

## 2013-07-06 MED ORDER — DOXYCYCLINE HYCLATE 100 MG PO TABS: 100.0000 mg | ORAL_TABLET | Freq: Two times a day (BID) | ORAL | Status: DC

## 2013-07-06 NOTE — Progress Notes (Signed)
   Subjective:    Patient ID: Christopher Hatfield, male    DOB: May 15, 1981, 33 y.o.   MRN: [id number]  HPI  33 year old male with B carpal tunnel L > R.  He has more symptoms in left arm greater than right. Cannot sleep at night due to pain.  Feels weakness, and pain in left wrist and arm (also right but not as much)    Using tramadol for pain, helps temporarily. Ibuprofen 800 mg does not help with pain. Failed diclofenac.. Ineffective.  He has been wearing brace on left hand at night off and on in last few weeks... No help at this point.  New issue: He has noted a few days of swelling, redness  in left  dorsal hand . He has trouble making a fist at times due to tightness with swelling. There is a small red pinpoint prick at center of erythematous area. He has a lot of scratches from clearing yard.  Review of Systems  Constitutional: Negative for fever and fatigue.  HENT: Negative for ear pain.   Eyes: Negative for pain.  Respiratory: Negative for shortness of breath.   Cardiovascular: Negative for chest pain.       Objective:   Physical Exam  Constitutional: Vital signs are normal. He appears well-developed and well-nourished.  HENT:  Head: Normocephalic.  Right Ear: Hearing normal.  Left Ear: Hearing normal.  Nose: Nose normal.  Mouth/Throat: Oropharynx is clear and moist and mucous membranes are normal.  Neck: Trachea normal. Carotid bruit is not present. No mass and no thyromegaly present.  Cardiovascular: Normal rate, regular rhythm and normal pulses.  Exam reveals no gallop, no distant heart sounds and no friction rub.   No murmur heard. No peripheral edema  Pulmonary/Chest: Effort normal and breath sounds normal. No respiratory distress.  Musculoskeletal:       Left shoulder: He exhibits normal range of motion, no tenderness and no bony tenderness.       Left elbow: He exhibits normal range of motion, no swelling and no effusion. No tenderness found.       Left wrist:  He exhibits decreased range of motion and tenderness.       Cervical back: He exhibits tenderness. He exhibits normal range of motion.  forearm tender to palpation mildly Neg Spurling's.  Redness (5 cm diameter),increased warmth and swelling in dorsal hand, not focused on MCP joints, central excoriation  multiple excoriations over B forearms.  Neurological: He has normal strength. He exhibits normal muscle tone. He displays a negative Romberg sign. Coordination normal.  Skin: Skin is warm, dry and intact. No rash noted.  Psychiatric: He has a normal mood and affect. His speech is normal and behavior is normal. Thought content normal.          Assessment & Plan:

## 2013-07-06 NOTE — Progress Notes (Signed)
Pre-visit discussion using our clinic review tool. No additional management support is needed unless otherwise documented below in the visit note.  

## 2013-07-06 NOTE — Telephone Encounter (Signed)
Message copied by Excell SeltzerBEDSOLE, AMY E on Fri Jul 06, 2013 10:58 AM ------      Message from: Nita SicklePATEL, DONIKA K      Created: Thu Jul 05, 2013  2:14 PM       Thank you for the referral.  He has bilateral (L >R) carpal tunnel syndrome.  No evidence of ulnar neuropathy on NCS/EMG. Let me know if you have any questions.  Thank you, Donika. ------

## 2013-07-06 NOTE — Assessment & Plan Note (Signed)
No benefit with bracing. Severe pain on left arm/wrist.  Refer to hand specialist for consideration of injection vs surgery.

## 2013-07-06 NOTE — Telephone Encounter (Signed)
Mr. Cliffton AstersWhite notified as instructed by telephone.  He is scheduled to see Dr. Ermalene SearingBedsole this afternoon at 4 pm to discuss findings.

## 2013-07-06 NOTE — Assessment & Plan Note (Signed)
Less likely secondary to carpal tunnel. Concerning for introduction of infection into scratch. Treat with antibiotics ( allergic to PCN, sulfa)  Follow up if not improving. Call if spreading redness or fever.

## 2013-07-06 NOTE — Patient Instructions (Signed)
Can use tramadol 2 tab at a time for pain interupting sleep. Stop at front desk to set up referral to hand specialist. Start antibiotics for skin infection. Call if redness and swelling in spreading instead of improving in next 2-3 days. Go fever on antibiotics go to ER.

## 2013-07-06 NOTE — Telephone Encounter (Signed)
Notify pt that he does have B carpal tunnel L>R. No sign of ulnar never entrapment or muscle disorder. We initially treat this with B carpal tunnel braces at night. If this does not improve symptoms we can refer to hand specialist for other treatment

## 2013-07-08 ENCOUNTER — Other Ambulatory Visit: Payer: Self-pay | Admitting: Family Medicine

## 2013-07-08 NOTE — Telephone Encounter (Signed)
Last office visit 07/06/13.  Ok to refill?

## 2013-07-09 NOTE — Telephone Encounter (Signed)
Called to [facility]. 

## 2013-07-19 ENCOUNTER — Other Ambulatory Visit: Payer: Self-pay | Admitting: Family Medicine

## 2013-07-19 NOTE — Telephone Encounter (Signed)
Last office visit 07/06/2013.  Last refilled on  07/09/2013 for #30.  Ok to refill?

## 2013-07-20 ENCOUNTER — Encounter (HOSPITAL_BASED_OUTPATIENT_CLINIC_OR_DEPARTMENT_OTHER): Payer: Self-pay | Admitting: *Deleted

## 2013-07-20 ENCOUNTER — Other Ambulatory Visit: Payer: Self-pay | Admitting: Orthopedic Surgery

## 2013-07-20 NOTE — Telephone Encounter (Signed)
Called to [facility]. 

## 2013-07-27 ENCOUNTER — Ambulatory Visit (HOSPITAL_BASED_OUTPATIENT_CLINIC_OR_DEPARTMENT_OTHER)
Admission: RE | Admit: 2013-07-27 | Discharge: 2013-07-27 | Disposition: A | Discharge status: Home / Self Care | Payer: BC Managed Care – PPO | Source: Ambulatory Visit | Attending: Orthopedic Surgery | Admitting: Orthopedic Surgery

## 2013-07-27 ENCOUNTER — Ambulatory Visit (HOSPITAL_BASED_OUTPATIENT_CLINIC_OR_DEPARTMENT_OTHER): Payer: BC Managed Care – PPO | Admitting: Anesthesiology

## 2013-07-27 ENCOUNTER — Encounter (HOSPITAL_BASED_OUTPATIENT_CLINIC_OR_DEPARTMENT_OTHER): Payer: Self-pay | Admitting: *Deleted

## 2013-07-27 ENCOUNTER — Encounter (HOSPITAL_BASED_OUTPATIENT_CLINIC_OR_DEPARTMENT_OTHER)
Admission: RE | Disposition: A | Discharge status: Home / Self Care | Payer: Self-pay | Source: Ambulatory Visit | Attending: Orthopedic Surgery

## 2013-07-27 ENCOUNTER — Encounter (HOSPITAL_BASED_OUTPATIENT_CLINIC_OR_DEPARTMENT_OTHER): Payer: BC Managed Care – PPO | Admitting: Anesthesiology

## 2013-07-27 DIAGNOSIS — Z87891 Personal history of nicotine dependence: Secondary | ICD-10-CM | POA: Insufficient documentation

## 2013-07-27 DIAGNOSIS — G5600 Carpal tunnel syndrome, unspecified upper limb: Secondary | ICD-10-CM | POA: Insufficient documentation

## 2013-07-27 DIAGNOSIS — K219 Gastro-esophageal reflux disease without esophagitis: Secondary | ICD-10-CM | POA: Insufficient documentation

## 2013-07-27 HISTORY — PX: CARPAL TUNNEL RELEASE: SHX101

## 2013-07-27 LAB — POCT HEMOGLOBIN-HEMACUE: Hemoglobin: 14.3 g/dL (ref 13.0–17.0)

## 2013-07-27 SURGERY — CARPAL TUNNEL RELEASE
Anesthesia: General | Site: Wrist | Laterality: Left

## 2013-07-27 MED ORDER — VANCOMYCIN HCL IN DEXTROSE 1-5 GM/200ML-% IV SOLN
INTRAVENOUS | Status: AC
  Filled 2013-07-27: qty 200

## 2013-07-27 MED ORDER — MIDAZOLAM HCL 5 MG/5ML IJ SOLN
INTRAMUSCULAR | Status: DC | PRN
  Administered 2013-07-27: 2 mg via INTRAVENOUS

## 2013-07-27 MED ORDER — FENTANYL CITRATE 0.05 MG/ML IJ SOLN
INTRAMUSCULAR | Status: AC
  Filled 2013-07-27: qty 2

## 2013-07-27 MED ORDER — PROPOFOL 10 MG/ML IV BOLUS
INTRAVENOUS | Status: DC | PRN
  Administered 2013-07-27: 180 mg via INTRAVENOUS

## 2013-07-27 MED ORDER — ONDANSETRON HCL 4 MG/2ML IJ SOLN: 4.0000 mg | Freq: Once | INTRAMUSCULAR | Status: DC | PRN

## 2013-07-27 MED ORDER — ONDANSETRON HCL 4 MG/2ML IJ SOLN
INTRAMUSCULAR | Status: DC | PRN
Start: 2013-07-27 — End: 2013-07-27
  Administered 2013-07-27: 4 mg via INTRAVENOUS

## 2013-07-27 MED ORDER — FENTANYL CITRATE 0.05 MG/ML IJ SOLN: 50.0000 ug | INTRAMUSCULAR | Status: DC | PRN

## 2013-07-27 MED ORDER — PROPOFOL 10 MG/ML IV BOLUS
INTRAVENOUS | Status: AC
  Filled 2013-07-27: qty 20

## 2013-07-27 MED ORDER — LIDOCAINE HCL (CARDIAC) 20 MG/ML IV SOLN
INTRAVENOUS | Status: DC | PRN
Start: 2013-07-27 — End: 2013-07-27
  Administered 2013-07-27: 80 mg via INTRAVENOUS

## 2013-07-27 MED ORDER — MIDAZOLAM HCL 2 MG/ML PO SYRP: 12.0000 mg | ORAL_SOLUTION | Freq: Once | ORAL | Status: DC | PRN

## 2013-07-27 MED ORDER — BUPIVACAINE HCL (PF) 0.25 % IJ SOLN
INTRAMUSCULAR | Status: DC | PRN
  Administered 2013-07-27: 8 mL

## 2013-07-27 MED ORDER — LACTATED RINGERS IV SOLN
INTRAVENOUS | Status: DC
  Administered 2013-07-27: 12:00:00 via INTRAVENOUS

## 2013-07-27 MED ORDER — FENTANYL CITRATE 0.05 MG/ML IJ SOLN
INTRAMUSCULAR | Status: DC | PRN
  Administered 2013-07-27: 100 ug via INTRAVENOUS

## 2013-07-27 MED ORDER — MIDAZOLAM HCL 2 MG/2ML IJ SOLN: 1.0000 mg | INTRAMUSCULAR | Status: DC | PRN

## 2013-07-27 MED ORDER — HYDROMORPHONE HCL PF 1 MG/ML IJ SOLN: 0.2500 mg | INTRAMUSCULAR | Status: DC | PRN

## 2013-07-27 MED ORDER — CHLORHEXIDINE GLUCONATE 4 % EX LIQD: 60.0000 mL | Freq: Once | CUTANEOUS | Status: DC

## 2013-07-27 MED ORDER — OXYCODONE HCL 5 MG/5ML PO SOLN: 5.0000 mg | Freq: Once | ORAL | Status: AC | PRN

## 2013-07-27 MED ORDER — HYDROCODONE-ACETAMINOPHEN 5-325 MG PO TABS: ORAL_TABLET | ORAL | Status: DC

## 2013-07-27 MED ORDER — MIDAZOLAM HCL 2 MG/2ML IJ SOLN
INTRAMUSCULAR | Status: AC
  Filled 2013-07-27: qty 2

## 2013-07-27 MED ORDER — DEXAMETHASONE SODIUM PHOSPHATE 10 MG/ML IJ SOLN
INTRAMUSCULAR | Status: DC | PRN
  Administered 2013-07-27: 10 mg via INTRAVENOUS

## 2013-07-27 MED ORDER — OXYCODONE HCL 5 MG PO TABS
5.0000 mg | ORAL_TABLET | Freq: Once | ORAL | Status: AC | PRN
  Administered 2013-07-27: 5 mg via ORAL
  Filled 2013-07-27: qty 1

## 2013-07-27 MED ORDER — VANCOMYCIN HCL IN DEXTROSE 1-5 GM/200ML-% IV SOLN
1000.0000 mg | INTRAVENOUS | Status: AC
  Administered 2013-07-27: 1000 mg via INTRAVENOUS

## 2013-07-27 SURGICAL SUPPLY — 36 items
BANDAGE ELASTIC 3 VELCRO ST LF (GAUZE/BANDAGES/DRESSINGS) ×2 IMPLANT
BLADE MINI RND TIP GREEN BEAV (BLADE) ×1 IMPLANT
BLADE SURG 15 STRL LF DISP TIS (BLADE) ×2 IMPLANT
BLADE SURG 15 STRL SS (BLADE) ×4
BNDG CMPR 9X4 STRL LF SNTH (GAUZE/BANDAGES/DRESSINGS) ×1
BNDG ESMARK 4X9 LF (GAUZE/BANDAGES/DRESSINGS) ×1 IMPLANT
BNDG GAUZE ELAST 4 BULKY (GAUZE/BANDAGES/DRESSINGS) ×2 IMPLANT
CHLORAPREP W/TINT 26ML (MISCELLANEOUS) ×2 IMPLANT
CORDS BIPOLAR (ELECTRODE) ×2 IMPLANT
COVER MAYO STAND STRL (DRAPES) ×2 IMPLANT
COVER TABLE BACK 60X90 (DRAPES) ×2 IMPLANT
CUFF TOURNIQUET SINGLE 18IN (TOURNIQUET CUFF) ×2 IMPLANT
DRAPE EXTREMITY T 121X128X90 (DRAPE) ×2 IMPLANT
DRAPE SURG 17X23 STRL (DRAPES) ×2 IMPLANT
GAUZE XEROFORM 1X8 LF (GAUZE/BANDAGES/DRESSINGS) ×2 IMPLANT
GLOVE BIO SURGEON STRL SZ7.5 (GLOVE) ×2 IMPLANT
GLOVE BIOGEL PI IND STRL 8 (GLOVE) ×1 IMPLANT
GLOVE BIOGEL PI INDICATOR 8 (GLOVE) ×1
GLOVE ECLIPSE 6.5 STRL STRAW (GLOVE) ×1 IMPLANT
GOWN STRL REUS W/ TWL LRG LVL3 (GOWN DISPOSABLE) ×1 IMPLANT
GOWN STRL REUS W/TWL LRG LVL3 (GOWN DISPOSABLE) ×2
GOWN STRL REUS W/TWL XL LVL3 (GOWN DISPOSABLE) ×2 IMPLANT
NDL HYPO 25X1 1.5 SAFETY (NEEDLE) IMPLANT
NEEDLE HYPO 25X1 1.5 SAFETY (NEEDLE) ×2 IMPLANT
NS IRRIG 1000ML POUR BTL (IV SOLUTION) ×2 IMPLANT
PACK BASIN DAY SURGERY FS (CUSTOM PROCEDURE TRAY) ×2 IMPLANT
PAD ABD 8X10 STRL (GAUZE/BANDAGES/DRESSINGS) ×2 IMPLANT
PADDING CAST ABS 4INX4YD NS (CAST SUPPLIES) ×1
PADDING CAST ABS COTTON 4X4 ST (CAST SUPPLIES) ×1 IMPLANT
SPONGE GAUZE 4X4 12PLY (GAUZE/BANDAGES/DRESSINGS) ×2 IMPLANT
STOCKINETTE 4X48 STRL (DRAPES) ×2 IMPLANT
SUT ETHILON 4 0 PS 2 18 (SUTURE) ×2 IMPLANT
SYR BULB 3OZ (MISCELLANEOUS) ×2 IMPLANT
SYR CONTROL 10ML LL (SYRINGE) ×1 IMPLANT
TOWEL OR 17X24 6PK STRL BLUE (TOWEL DISPOSABLE) ×4 IMPLANT
UNDERPAD 30X30 INCONTINENT (UNDERPADS AND DIAPERS) ×2 IMPLANT

## 2013-07-27 NOTE — Anesthesia Postprocedure Evaluation (Signed)
  Anesthesia Post-op Note  Patient: Christopher Hatfield  Procedure(s) Performed: Procedure(s): LEFT CARPAL TUNNEL RELEASE (Left)  Patient Location: PACU  Anesthesia Type:General  Level of Consciousness: awake, alert  and oriented  Airway and Oxygen Therapy: Patient Spontanous Breathing  Post-op Pain: none  Post-op Assessment: Post-op Vital signs reviewed  Post-op Vital Signs: Reviewed  Complications: No apparent anesthesia complications

## 2013-07-27 NOTE — H&P (Signed)
  Christopher Hatfield is an 33 y.o. male.   Chief Complaint: carpal tunnel syndrome HPI: 33 yo rhd male with pins and needles sensation of bilateral hands x 3 months.  Nocturnal symptoms nightly that wake him.  Positive nerve conduction studies.  He wishes to have carpal tunnel release.  Past Medical History  Diagnosis Date  . Carpal tunnel syndrome of left wrist 06/2013    Past Surgical History  Procedure Laterality Date  . Vasectomy    . Tonsillectomy    . Wisdom tooth extraction      Family History  Problem Relation Age of Onset  . Depression Mother   . Deep vein thrombosis Father   . Heart attack Maternal Grandmother   . Diabetes Paternal Grandmother   . Cancer Paternal Grandfather     PROSTATE   Social History:  reports that he has quit smoking. He has quit using smokeless tobacco. He reports that he does not drink alcohol or use illicit drugs.  Allergies:  Allergies  Allergen Reactions  . Penicillins Swelling  . Sulfonamide Derivatives Swelling    Medications Prior to Admission  Medication Sig Dispense Refill  . traMADol (ULTRAM) 50 MG tablet TAKE 1 TABLET BY MOUTH EVERY 8 HOURS AS NEEDED FOR PAIN  30 tablet  0    No results found for this or any previous visit (from the past 48 hour(s)).  No results found.   A comprehensive review of systems was negative.  Blood pressure 114/76, pulse 68, temperature 98.3 F (36.8 C), temperature source Oral, resp. rate 18, height 5\' 8"  (1.727 m), weight 186 lb (84.369 kg), SpO2 100.00%.  General appearance: alert, cooperative and appears stated age Head: Normocephalic, without obvious abnormality, atraumatic Neck: supple, symmetrical, trachea midline Resp: clear to auscultation bilaterally Cardio: regular rate and rhythm GI: non tender Extremities: intact sensation and capillary refill all digits.  +epl/fpl/io.  no wounds. Pulses: 2+ and symmetric Skin: Skin color, texture, turgor normal. No rashes or lesions Neurologic:  Grossly normal Incision/Wound: none  Assessment/Plan Left carpal tunnel syndrome.  Non operative and operative treatment options were discussed with the patient and patient wishes to proceed with operative treatment. Risks, benefits, and alternatives of surgery were discussed and the patient agrees with the plan of care.   Cadell Gabrielson R 07/27/2013, 12:45 PM

## 2013-07-27 NOTE — Anesthesia Preprocedure Evaluation (Signed)
Anesthesia Evaluation  Patient identified by MRN, date of birth, ID band Patient awake    Reviewed: Allergy & Precautions, H&P , NPO status , Patient's Chart, lab work & pertinent test results  Airway Mallampati: I TM Distance: >3 FB Neck ROM: Full    Dental  (+) Teeth Intact and Dental Advisory Given   Pulmonary former smoker,  breath sounds clear to auscultation        Cardiovascular Rhythm:Regular Rate:Normal     Neuro/Psych    GI/Hepatic GERD-  Medicated and Controlled,  Endo/Other    Renal/GU      Musculoskeletal   Abdominal   Peds  Hematology   Anesthesia Other Findings   Reproductive/Obstetrics                           Anesthesia Physical Anesthesia Plan  ASA: II  Anesthesia Plan: General   Post-op Pain Management:    Induction: Intravenous  Airway Management Planned: LMA  Additional Equipment:   Intra-op Plan:   Post-operative Plan: Extubation in OR  Informed Consent: I have reviewed the patients History and Physical, chart, labs and discussed the procedure including the risks, benefits and alternatives for the proposed anesthesia with the patient or authorized representative who has indicated his/her understanding and acceptance.   Dental advisory given  Plan Discussed with: CRNA, Anesthesiologist and Surgeon  Anesthesia Plan Comments:         Anesthesia Quick Evaluation

## 2013-07-27 NOTE — Transfer of Care (Signed)
Immediate Anesthesia Transfer of Care Note  Patient: Christopher Hatfield  Procedure(s) Performed: Procedure(s): LEFT CARPAL TUNNEL RELEASE (Left)  Patient Location: PACU  Anesthesia Type:General  Level of Consciousness: awake, sedated and patient cooperative  Airway & Oxygen Therapy: Patient Spontanous Breathing and Patient connected to face mask oxygen  Post-op Assessment: Report given to PACU RN and Post -op Vital signs reviewed and stable  Post vital signs: Reviewed and stable  Complications: No apparent anesthesia complications

## 2013-07-27 NOTE — Op Note (Signed)
[id number] 

## 2013-07-27 NOTE — Discharge Instructions (Addendum)
[facility] Instructions °Hand Surgery ° °Wound Care: °Keep your hand elevated above the level of your heart.  Do not allow it to dangle by your side.  Keep the dressing dry and do not remove it unless your doctor advises you to do so.  He will usually change it at the time of your post-op visit.  Moving your fingers is advised to stimulate circulation but will depend on the site of your surgery.  If you have a splint applied, your doctor will advise you regarding movement. ° °Activity: °Do not drive or operate machinery today.  Rest today and then you may return to your normal activity and work as indicated by your physician. ° °Diet:  °Drink liquids today or eat a light diet.  You may resume a regular diet tomorrow.   ° °General expectations: °Pain for two to three days. °Fingers may become slightly swollen. ° °Call your doctor if any of the following occur: °Severe pain not relieved by pain medication. °Elevated temperature. °Dressing soaked with blood. °Inability to move fingers. °Baines or bluish color to fingers. ° ° °Post Anesthesia Home Care Instructions ° °Activity: °Get plenty of rest for the remainder of the day. A responsible adult should stay with you for 24 hours following the procedure.  °For the next 24 hours, DO NOT: °-Drive a car °-Operate machinery °-Drink alcoholic beverages °-Take any medication unless instructed by your physician °-Make any legal decisions or sign important papers. ° °Meals: °Start with liquid foods such as gelatin or soup. Progress to regular foods as tolerated. Avoid greasy, spicy, heavy foods. If nausea and/or vomiting occur, drink only clear liquids until the nausea and/or vomiting subsides. Call your physician if vomiting continues. ° °Special Instructions/Symptoms: °Your throat may feel dry or sore from the anesthesia or the breathing tube placed in your throat during surgery. If this causes discomfort, gargle with warm salt water. The discomfort should disappear within 24  hours. ° °

## 2013-07-27 NOTE — Brief Op Note (Signed)
07/27/2013  1:50 PM  PATIENT:  Tollie Pizzaimothy J Eriksen  [age] y.o. male  PRE-OPERATIVE DIAGNOSIS:  LEFT CARPAL TUNNEL SYNDROME  POST-OPERATIVE DIAGNOSIS:  left carpel tunnel syndrome  PROCEDURE:  Procedure(s): LEFT CARPAL TUNNEL RELEASE (Left)  SURGEON:  Surgeon(s) and Role:    * Tami RibasKevin R Alcides Nutting, MD - Primary  PHYSICIAN ASSISTANT:   ASSISTANTS: none   ANESTHESIA:   general  EBL:     BLOOD ADMINISTERED:none  DRAINS: none   LOCAL MEDICATIONS USED:  MARCAINE     SPECIMEN:  No Specimen  DISPOSITION OF SPECIMEN:  N/A  COUNTS:  YES  TOURNIQUET:   Total Tourniquet Time Documented: Upper Arm (Left) - 15 minutes Total: Upper Arm (Left) - 15 minutes   DICTATION: .Other Dictation: Dictation Number [id number]  PLAN OF CARE: Discharge to home after PACU  PATIENT DISPOSITION:  PACU - hemodynamically stable.

## 2013-07-28 NOTE — Op Note (Signed)
NAMG[facility] Ramp:  Hatfield, Christopher               ACCOUNT NO.:  [id number]  MEDICAL RECORD NO.:  [id number]  LOCATION:                                 FACILITY:  PHYSICIAN:  Betha LoaKevin Samra Pesch, MD             DATE OF BIRTH:  DATE OF PROCEDURE:  07/27/2013 DATE OF DISCHARGE:                              OPERATIVE REPORT   PREOPERATIVE DIAGNOSIS:  Left carpal tunnel syndrome.  POSTOPERATIVE DIAGNOSIS:  Left carpal tunnel syndrome.  PROCEDURE:  Left carpal tunnel release.  SURGEON:  Betha LoaKevin Jakyrie Totherow, MD.  ASSISTANT:  None.  ANESTHESIA:  General.  IV FLUIDS:  Per anesthesia flow sheet.  ESTIMATED BLOOD LOSS:  Minimal.  COMPLICATIONS:  None.  SPECIMENS:  None.  TOURNIQUET TIME:  14 minutes.  DISPOSITION:  Stable to PACU.  INDICATIONS:  Christopher Hatfield is a 33 year old male who has pins and needle sensation in bilateral hands.  He has positive nerve conduction studies and nocturnal symptoms that wake him nightly.  He wished to have a carpal tunnel release for management of symptoms.  Risks, benefits, and alternatives of the surgery were discussed including risk of blood loss, infection; damage to nerves, vessels, tendons, ligaments, bone; failure of surgery; need for additional surgery, complications with wound healing, continued pain, continued carpal tunnel syndrome.  He voiced understanding of these risks and elected to proceed.  OPERATIVE COURSE:  After being identified preoperatively by myself, the patient and I agreed upon procedure and site of procedure.  Surgical site was marked.  Risks, benefits, and alternatives of surgery were reviewed and wished to proceed.  Surgical consent had been signed.  He was given IV vancomycin as preoperative antibiotic prophylaxis due to penicillin allergy.  He was transferred to the operating room, placed on the operating room table in supine position with the left upper extremity on arm board.  General anesthesia was induced by Anesthesiology.  Left upper  extremity was prepped and draped in normal sterile orthopedic fashion.  Surgical pause was performed between surgeons, anesthesia, operating room staff, and all were in agreement as to the patient, procedure, and site of procedure.  Tourniquet at the proximal aspect of the extremity was inflated to 250 mmHg after exsanguination of the limb with Esmarch bandage.  An incision was made over the transverse carpal ligament.  It was carried into subcutaneous tissues by spreading technique.  Bipolar electrocautery was used to obtain hemostasis.  The palmar fascia was sharply incised.  The transverse carpal ligament was identified and was incised sharply.  Care was taken to ensure complete decompression distally which was the case. It was then incised proximally.  The scissors were used to split the distal aspect of the volar antebrachial fascia.  The finger was placed into the wound to ensure complete decompression which was the case.  The motor branch was identified and it was intact.  The wound was copiously irrigated with sterile saline.  It was closed with 4-0 nylon in a horizontal mattress fashion.  It was injected with 8 mL of 0.25% plain Marcaine to aid in postoperative analgesia.  It was then dressed with sterile Xeroform, 4x4s, and ABD and wrapped with  Kerlix and Ace bandage. Tourniquet was deflated at 14 minutes.  Fingertips were pink with brisk capillary refill after deflation of tourniquet.  Operative drapes were broken down.  The patient was woken from anesthesia safely.  He was transferred back to stretcher and taken to PACU in stable condition.  I will see him back in the office in 1 week for postoperative followup.  I will give him Norco 5/325, 1-2 p.o. q.6 hours p.r.n. pain, dispensed #40.     Betha Loa, MD     KK/MEDQ  D:  07/27/2013  T:  07/28/2013  Job:  [id number]

## 2013-07-30 ENCOUNTER — Encounter (HOSPITAL_BASED_OUTPATIENT_CLINIC_OR_DEPARTMENT_OTHER): Payer: Self-pay | Admitting: Orthopedic Surgery

## 2013-08-03 ENCOUNTER — Other Ambulatory Visit: Payer: Self-pay | Admitting: Family Medicine

## 2013-08-03 NOTE — Telephone Encounter (Signed)
Last office visit 07/06/2013.  Not on current medication list. Ok to refill?

## 2013-08-03 NOTE — Telephone Encounter (Signed)
Called to [facility]. 

## 2013-08-10 ENCOUNTER — Other Ambulatory Visit: Payer: Self-pay | Admitting: Family Medicine

## 2013-08-10 NOTE — Telephone Encounter (Signed)
Last office visit 07/06/2013.  Ok to refill? 

## 2013-08-10 NOTE — Telephone Encounter (Signed)
[facility] notified refill request denied.  Just filled on 08/03/2013.

## 2013-08-19 ENCOUNTER — Other Ambulatory Visit: Payer: Self-pay | Admitting: Family Medicine

## 2013-08-19 NOTE — Telephone Encounter (Signed)
Last office visit 07/06/2013.   Last refilled 08/03/2013 for #30.  Ok to refill?

## 2013-08-20 NOTE — Telephone Encounter (Signed)
Called to [facility]. 

## 2013-08-26 DIAGNOSIS — G5601 Carpal tunnel syndrome, right upper limb: Secondary | ICD-10-CM

## 2013-08-26 HISTORY — DX: Carpal tunnel syndrome, right upper limb: G56.01

## 2013-08-31 ENCOUNTER — Encounter (HOSPITAL_BASED_OUTPATIENT_CLINIC_OR_DEPARTMENT_OTHER): Payer: Self-pay | Admitting: *Deleted

## 2013-08-31 ENCOUNTER — Other Ambulatory Visit: Payer: Self-pay | Admitting: Orthopedic Surgery

## 2013-09-07 ENCOUNTER — Encounter (HOSPITAL_BASED_OUTPATIENT_CLINIC_OR_DEPARTMENT_OTHER): Payer: Self-pay | Admitting: *Deleted

## 2013-09-07 ENCOUNTER — Ambulatory Visit (HOSPITAL_BASED_OUTPATIENT_CLINIC_OR_DEPARTMENT_OTHER)
Admission: RE | Admit: 2013-09-07 | Discharge: 2013-09-07 | Disposition: A | Discharge status: Home / Self Care | Payer: BC Managed Care – PPO | Source: Ambulatory Visit | Attending: Orthopedic Surgery | Admitting: Orthopedic Surgery

## 2013-09-07 ENCOUNTER — Encounter (HOSPITAL_BASED_OUTPATIENT_CLINIC_OR_DEPARTMENT_OTHER): Payer: BC Managed Care – PPO | Admitting: Anesthesiology

## 2013-09-07 ENCOUNTER — Encounter (HOSPITAL_BASED_OUTPATIENT_CLINIC_OR_DEPARTMENT_OTHER)
Admission: RE | Disposition: A | Discharge status: Home / Self Care | Payer: Self-pay | Source: Ambulatory Visit | Attending: Orthopedic Surgery

## 2013-09-07 ENCOUNTER — Ambulatory Visit (HOSPITAL_BASED_OUTPATIENT_CLINIC_OR_DEPARTMENT_OTHER): Payer: BC Managed Care – PPO | Admitting: Anesthesiology

## 2013-09-07 DIAGNOSIS — Z87891 Personal history of nicotine dependence: Secondary | ICD-10-CM | POA: Insufficient documentation

## 2013-09-07 DIAGNOSIS — K219 Gastro-esophageal reflux disease without esophagitis: Secondary | ICD-10-CM | POA: Insufficient documentation

## 2013-09-07 DIAGNOSIS — G5600 Carpal tunnel syndrome, unspecified upper limb: Secondary | ICD-10-CM | POA: Insufficient documentation

## 2013-09-07 HISTORY — PX: CARPAL TUNNEL RELEASE: SHX101

## 2013-09-07 HISTORY — DX: Carpal tunnel syndrome, right upper limb: G56.01

## 2013-09-07 LAB — POCT HEMOGLOBIN-HEMACUE: HEMOGLOBIN: 13.9 g/dL (ref 13.0–17.0)

## 2013-09-07 SURGERY — CARPAL TUNNEL RELEASE
Anesthesia: General | Site: Wrist | Laterality: Right

## 2013-09-07 MED ORDER — BUPIVACAINE HCL (PF) 0.25 % IJ SOLN
INTRAMUSCULAR | Status: AC
  Filled 2013-09-07: qty 30

## 2013-09-07 MED ORDER — OXYCODONE-ACETAMINOPHEN 5-325 MG PO TABS: ORAL_TABLET | ORAL | Status: DC

## 2013-09-07 MED ORDER — PROPOFOL 10 MG/ML IV BOLUS
INTRAVENOUS | Status: DC | PRN
  Administered 2013-09-07: 25 mg via INTRAVENOUS
  Administered 2013-09-07: 200 mg via INTRAVENOUS

## 2013-09-07 MED ORDER — OXYCODONE HCL 5 MG PO TABS
5.0000 mg | ORAL_TABLET | Freq: Once | ORAL | Status: AC | PRN
  Administered 2013-09-07: 5 mg via ORAL

## 2013-09-07 MED ORDER — OXYCODONE HCL 5 MG/5ML PO SOLN: 5.0000 mg | Freq: Once | ORAL | Status: AC | PRN

## 2013-09-07 MED ORDER — MIDAZOLAM HCL 2 MG/ML PO SYRP: 12.0000 mg | ORAL_SOLUTION | Freq: Once | ORAL | Status: DC | PRN

## 2013-09-07 MED ORDER — HYDROMORPHONE HCL PF 1 MG/ML IJ SOLN
INTRAMUSCULAR | Status: AC
  Filled 2013-09-07: qty 1

## 2013-09-07 MED ORDER — MIDAZOLAM HCL 2 MG/2ML IJ SOLN: 1.0000 mg | INTRAMUSCULAR | Status: DC | PRN

## 2013-09-07 MED ORDER — CHLORHEXIDINE GLUCONATE 4 % EX LIQD: 60.0000 mL | Freq: Once | CUTANEOUS | Status: DC

## 2013-09-07 MED ORDER — FENTANYL CITRATE 0.05 MG/ML IJ SOLN
INTRAMUSCULAR | Status: AC
  Filled 2013-09-07: qty 2

## 2013-09-07 MED ORDER — HYDROMORPHONE HCL PF 1 MG/ML IJ SOLN
0.2500 mg | INTRAMUSCULAR | Status: DC | PRN
  Administered 2013-09-07 (×2): 0.5 mg via INTRAVENOUS

## 2013-09-07 MED ORDER — PROPOFOL 10 MG/ML IV EMUL
INTRAVENOUS | Status: AC
Start: 2013-09-07 — End: 2013-09-07
  Filled 2013-09-07: qty 50

## 2013-09-07 MED ORDER — ONDANSETRON HCL 4 MG/2ML IJ SOLN
INTRAMUSCULAR | Status: DC | PRN
  Administered 2013-09-07: 4 mg via INTRAVENOUS

## 2013-09-07 MED ORDER — VANCOMYCIN HCL IN DEXTROSE 1-5 GM/200ML-% IV SOLN
INTRAVENOUS | Status: AC
  Filled 2013-09-07: qty 200

## 2013-09-07 MED ORDER — LIDOCAINE HCL (CARDIAC) 20 MG/ML IV SOLN
INTRAVENOUS | Status: DC | PRN
  Administered 2013-09-07: 80 mg via INTRAVENOUS

## 2013-09-07 MED ORDER — MIDAZOLAM HCL 5 MG/5ML IJ SOLN
INTRAMUSCULAR | Status: DC | PRN
  Administered 2013-09-07: 2 mg via INTRAVENOUS

## 2013-09-07 MED ORDER — DEXAMETHASONE SODIUM PHOSPHATE 4 MG/ML IJ SOLN
INTRAMUSCULAR | Status: DC | PRN
  Administered 2013-09-07: 10 mg via INTRAVENOUS

## 2013-09-07 MED ORDER — MIDAZOLAM HCL 2 MG/2ML IJ SOLN
INTRAMUSCULAR | Status: AC
  Filled 2013-09-07: qty 2

## 2013-09-07 MED ORDER — FENTANYL CITRATE 0.05 MG/ML IJ SOLN
INTRAMUSCULAR | Status: DC | PRN
  Administered 2013-09-07: 100 ug via INTRAVENOUS

## 2013-09-07 MED ORDER — PROMETHAZINE HCL 25 MG/ML IJ SOLN: 6.2500 mg | INTRAMUSCULAR | Status: DC | PRN

## 2013-09-07 MED ORDER — LACTATED RINGERS IV SOLN
INTRAVENOUS | Status: DC
  Administered 2013-09-07 (×2): via INTRAVENOUS

## 2013-09-07 MED ORDER — VANCOMYCIN HCL IN DEXTROSE 1-5 GM/200ML-% IV SOLN
1000.0000 mg | INTRAVENOUS | Status: AC
  Administered 2013-09-07: 1000 mg via INTRAVENOUS

## 2013-09-07 MED ORDER — FENTANYL CITRATE 0.05 MG/ML IJ SOLN: 50.0000 ug | INTRAMUSCULAR | Status: DC | PRN

## 2013-09-07 MED ORDER — OXYCODONE HCL 5 MG PO TABS
ORAL_TABLET | ORAL | Status: AC
  Filled 2013-09-07: qty 1

## 2013-09-07 MED ORDER — BUPIVACAINE HCL (PF) 0.25 % IJ SOLN
INTRAMUSCULAR | Status: DC | PRN
  Administered 2013-09-07: 10 mL

## 2013-09-07 SURGICAL SUPPLY — 38 items
BANDAGE ELASTIC 3 VELCRO ST LF (GAUZE/BANDAGES/DRESSINGS) ×2 IMPLANT
BLADE MINI RND TIP GREEN BEAV (BLADE) IMPLANT
BLADE SURG 15 STRL LF DISP TIS (BLADE) ×2 IMPLANT
BLADE SURG 15 STRL SS (BLADE) ×4
BNDG CMPR 9X4 STRL LF SNTH (GAUZE/BANDAGES/DRESSINGS)
BNDG ESMARK 4X9 LF (GAUZE/BANDAGES/DRESSINGS) IMPLANT
BNDG GAUZE ELAST 4 BULKY (GAUZE/BANDAGES/DRESSINGS) ×2 IMPLANT
CHLORAPREP W/TINT 26ML (MISCELLANEOUS) ×2 IMPLANT
CORDS BIPOLAR (ELECTRODE) ×2 IMPLANT
COVER MAYO STAND STRL (DRAPES) ×2 IMPLANT
COVER TABLE BACK 60X90 (DRAPES) ×2 IMPLANT
CUFF TOURNIQUET SINGLE 18IN (TOURNIQUET CUFF) ×2 IMPLANT
DRAPE EXTREMITY T 121X128X90 (DRAPE) ×2 IMPLANT
DRAPE SURG 17X23 STRL (DRAPES) ×2 IMPLANT
GAUZE XEROFORM 1X8 LF (GAUZE/BANDAGES/DRESSINGS) ×2 IMPLANT
GLOVE BIO SURGEON STRL SZ7.5 (GLOVE) ×2 IMPLANT
GLOVE BIOGEL PI IND STRL 7.0 (GLOVE) IMPLANT
GLOVE BIOGEL PI IND STRL 8 (GLOVE) ×1 IMPLANT
GLOVE BIOGEL PI INDICATOR 7.0 (GLOVE) ×1
GLOVE BIOGEL PI INDICATOR 8 (GLOVE) ×1
GLOVE ECLIPSE 6.5 STRL STRAW (GLOVE) ×1 IMPLANT
GOWN STRL REUS W/ TWL LRG LVL3 (GOWN DISPOSABLE) ×1 IMPLANT
GOWN STRL REUS W/TWL LRG LVL3 (GOWN DISPOSABLE) ×2
GOWN STRL REUS W/TWL XL LVL3 (GOWN DISPOSABLE) ×2 IMPLANT
NDL HYPO 25X1 1.5 SAFETY (NEEDLE) IMPLANT
NEEDLE HYPO 25X1 1.5 SAFETY (NEEDLE) ×2 IMPLANT
NS IRRIG 1000ML POUR BTL (IV SOLUTION) ×2 IMPLANT
PACK BASIN DAY SURGERY FS (CUSTOM PROCEDURE TRAY) ×2 IMPLANT
PAD ABD 8X10 STRL (GAUZE/BANDAGES/DRESSINGS) ×2 IMPLANT
PADDING CAST ABS 4INX4YD NS (CAST SUPPLIES) ×1
PADDING CAST ABS COTTON 4X4 ST (CAST SUPPLIES) ×1 IMPLANT
SPONGE GAUZE 4X4 12PLY (GAUZE/BANDAGES/DRESSINGS) ×2 IMPLANT
STOCKINETTE 4X48 STRL (DRAPES) ×2 IMPLANT
SUT ETHILON 4 0 PS 2 18 (SUTURE) ×2 IMPLANT
SYR BULB 3OZ (MISCELLANEOUS) ×2 IMPLANT
SYR CONTROL 10ML LL (SYRINGE) IMPLANT
TOWEL OR 17X24 6PK STRL BLUE (TOWEL DISPOSABLE) ×4 IMPLANT
UNDERPAD 30X30 INCONTINENT (UNDERPADS AND DIAPERS) ×2 IMPLANT

## 2013-09-07 NOTE — H&P (Signed)
  Christopher Hatfield is an 33 y.o. male.   Chief Complaint: right carpal tunnel syndrome HPI: 33 yo rhd male with 5 months pins and needles sensation in right hand.  Has nocturnal symptoms that wake him and cause him to shake his hand to get relief.  Positive nerve conduction studies.  Has had left carpal tunnel release with good results.  He wishes to have a right carpal tunnel release.  Past Medical History  Diagnosis Date  . Carpal tunnel syndrome of right wrist 08/2013    Past Surgical History  Procedure Laterality Date  . Vasectomy    . Tonsillectomy    . Wisdom tooth extraction    . Carpal tunnel release Left 07/27/2013    Procedure: LEFT CARPAL TUNNEL RELEASE;  Surgeon: Tami RibasKevin R Kelyn Koskela, MD;  Location: [facility];  Service: Orthopedics;  Laterality: Left;    Family History  Problem Relation Age of Onset  . Depression Mother   . Deep vein thrombosis Father   . Heart attack Maternal Grandmother   . Diabetes Paternal Grandmother   . Cancer Paternal Grandfather     PROSTATE   Social History:  reports that he has quit smoking. He has quit using smokeless tobacco. He reports that he does not drink alcohol or use illicit drugs.  Allergies:  Allergies  Allergen Reactions  . Penicillins Swelling  . Sulfonamide Derivatives Swelling    Medications Prior to Admission  Medication Sig Dispense Refill  . glucosamine-chondroitin 500-400 MG tablet Take 1 tablet by mouth 3 (three) times daily.      . traMADol (ULTRAM) 50 MG tablet TAKE 1 TABLET BY MOUTH EVERY 8 HOURS AS NEEDED FOR PAIN  60 tablet  0  . vitamin B-12 (CYANOCOBALAMIN) 100 MCG tablet Take 100 mcg by mouth daily.        Results for orders placed during the hospital encounter of 09/07/13 (from the past 48 hour(s))  POCT HEMOGLOBIN-HEMACUE     Status: None   Collection Time    09/07/13 12:53 PM      Result Value Ref Range   Hemoglobin 13.9  13.0 - 17.0 g/dL    No results found.   A comprehensive review of  systems was negative.  Blood pressure 121/68, pulse 54, temperature 97.8 F (36.6 C), temperature source Oral, resp. rate 16, height 5\' 8"  (1.727 m), weight 188 lb (85.276 kg), SpO2 100.00%.  General appearance: alert, cooperative and appears stated age Head: Normocephalic, without obvious abnormality, atraumatic Neck: supple, symmetrical, trachea midline Resp: clear to auscultation bilaterally Cardio: regular rate and rhythm GI: non tender Extremities: intact sensation and capillary refill all digits thought some fingers feel different.  +epl/fpl/io.  no wounds. Pulses: 2+ and symmetric Skin: Skin color, texture, turgor normal. No rashes or lesions Neurologic: Grossly normal Incision/Wound: none  Assessment/Plan Right carpal tunnel syndrome.  Non operative and operative treatment options were discussed with the patient and patient wishes to proceed with operative treatment. Risks, benefits, and alternatives of surgery were discussed and the patient agrees with the plan of care.   Hennie Gosa R 09/07/2013, 1:17 PM

## 2013-09-07 NOTE — Transfer of Care (Signed)
Immediate Anesthesia Transfer of Care Note  Patient: Christopher Hatfield  Procedure(s) Performed: Procedure(s): RIGHT CARPAL TUNNEL RELEASE (Right)  Patient Location: PACU  Anesthesia Type:General  Level of Consciousness: awake, sedated and patient cooperative  Airway & Oxygen Therapy: Patient Spontanous Breathing and Patient connected to face mask oxygen  Post-op Assessment: Report given to PACU RN and Post -op Vital signs reviewed and stable  Post vital signs: Reviewed and stable  Complications: No apparent anesthesia complications

## 2013-09-07 NOTE — Anesthesia Postprocedure Evaluation (Signed)
  Anesthesia Post-op Note  Patient: Christopher Hatfield  Procedure(s) Performed: Procedure(s): RIGHT CARPAL TUNNEL RELEASE (Right)  Patient Location: PACU  Anesthesia Type:General  Level of Consciousness: awake and alert   Airway and Oxygen Therapy: Patient Spontanous Breathing  Post-op Pain: mild  Post-op Assessment: Post-op Vital signs reviewed  Post-op Vital Signs: stable  Complications: No apparent anesthesia complications

## 2013-09-07 NOTE — Anesthesia Preprocedure Evaluation (Signed)
Anesthesia Evaluation  Patient identified by MRN, date of birth, ID band  Reviewed: Allergy & Precautions, H&P , NPO status , Patient's Chart, lab work & pertinent test results  Airway Mallampati: II  Neck ROM: Full    Dental  (+) Teeth Intact   Pulmonary former smoker,  breath sounds clear to auscultation        Cardiovascular Rhythm:Regular Rate:Normal     Neuro/Psych    GI/Hepatic GERD-  ,  Endo/Other    Renal/GU      Musculoskeletal   Abdominal   Peds  Hematology   Anesthesia Other Findings   Reproductive/Obstetrics                           Anesthesia Physical Anesthesia Plan  ASA: I  Anesthesia Plan: General   Post-op Pain Management:    Induction: Intravenous  Airway Management Planned: LMA  Additional Equipment:   Intra-op Plan:   Post-operative Plan: Extubation in OR  Informed Consent: I have reviewed the patients History and Physical, chart, labs and discussed the procedure including the risks, benefits and alternatives for the proposed anesthesia with the patient or authorized representative who has indicated his/her understanding and acceptance.   Dental advisory given  Plan Discussed with: CRNA and Surgeon  Anesthesia Plan Comments:         Anesthesia Quick Evaluation

## 2013-09-07 NOTE — Brief Op Note (Signed)
09/07/2013  2:14 PM  PATIENT:  Tollie Pizzaimothy J Schnyder  33 y.o. male  PRE-OPERATIVE DIAGNOSIS:  RIGHT CARPAL TUNNEL SYNDROME  POST-OPERATIVE DIAGNOSIS:  RIGHT CARPAL TUNNEL SYNDROME  PROCEDURE:  Procedure(s): RIGHT CARPAL TUNNEL RELEASE (Right)  SURGEON:  Surgeon(s) and Role:    * Tami RibasKevin R Imer Foxworth, MD - Primary  PHYSICIAN ASSISTANT:   ASSISTANTS: none   ANESTHESIA:   general  EBL:  Total I/O In: 1000 [I.V.:1000] Out: -   BLOOD ADMINISTERED:none  DRAINS: none   LOCAL MEDICATIONS USED:  MARCAINE     SPECIMEN:  No Specimen  DISPOSITION OF SPECIMEN:  N/A  COUNTS:  YES  TOURNIQUET:   Total Tourniquet Time Documented: Upper Arm (Right) - 19 minutes Total: Upper Arm (Right) - 19 minutes   DICTATION: .Other Dictation: Dictation Number [id number]  PLAN OF CARE: Discharge to home after PACU  PATIENT DISPOSITION:  PACU - hemodynamically stable.

## 2013-09-07 NOTE — Anesthesia Procedure Notes (Signed)
Procedure Name: LMA Insertion Date/Time: 09/07/2013 1:47 PM Performed by: Gar GibbonKEETON, Giannah Zavadil S Pre-anesthesia Checklist: Patient identified, Emergency Drugs available, Suction available and Patient being monitored Patient Re-evaluated:Patient Re-evaluated prior to inductionOxygen Delivery Method: Circle System Utilized Preoxygenation: Pre-oxygenation with 100% oxygen Intubation Type: IV induction Ventilation: Mask ventilation without difficulty LMA: LMA inserted LMA Size: 4.0 Number of attempts: 1 Airway Equipment and Method: bite block Placement Confirmation: positive ETCO2 Tube secured with: Tape Dental Injury: Teeth and Oropharynx as per pre-operative assessment

## 2013-09-07 NOTE — Discharge Instructions (Addendum)
[facility] Instructions °Hand Surgery ° °Wound Care: °Keep your hand elevated above the level of your heart.  Do not allow it to dangle by your side.  Keep the dressing dry and do not remove it unless your doctor advises you to do so.  He will usually change it at the time of your post-op visit.  Moving your fingers is advised to stimulate circulation but will depend on the site of your surgery.  If you have a splint applied, your doctor will advise you regarding movement. ° °Activity: °Do not drive or operate machinery today.  Rest today and then you may return to your normal activity and work as indicated by your physician. ° °Diet:  °Drink liquids today or eat a light diet.  You may resume a regular diet tomorrow.   ° °General expectations: °Pain for two to three days. °Fingers may become slightly swollen. ° °Call your doctor if any of the following occur: °Severe pain not relieved by pain medication. °Elevated temperature. °Dressing soaked with blood. °Inability to move fingers. °Krisko or bluish color to fingers. ° ° °Post Anesthesia Home Care Instructions ° °Activity: °Get plenty of rest for the remainder of the day. A responsible adult should stay with you for 24 hours following the procedure.  °For the next 24 hours, DO NOT: °-Drive a car °-Operate machinery °-Drink alcoholic beverages °-Take any medication unless instructed by your physician °-Make any legal decisions or sign important papers. ° °Meals: °Start with liquid foods such as gelatin or soup. Progress to regular foods as tolerated. Avoid greasy, spicy, heavy foods. If nausea and/or vomiting occur, drink only clear liquids until the nausea and/or vomiting subsides. Call your physician if vomiting continues. ° °Special Instructions/Symptoms: °Your throat may feel dry or sore from the anesthesia or the breathing tube placed in your throat during surgery. If this causes discomfort, gargle with warm salt water. The discomfort should disappear within 24  hours. ° °

## 2013-09-07 NOTE — Op Note (Signed)
[id number] 

## 2013-09-08 NOTE — Op Note (Signed)
[facility]:  Hatfield, Christopher               ACCOUNT NO.:  [id number]  MEDICAL RECORD NO.:  [id number]  LOCATION:                                 FACILITY:  PHYSICIAN:  Betha LoaKevin Iyla Balzarini, MD             DATE OF BIRTH:  DATE OF PROCEDURE:  09/07/2013 DATE OF DISCHARGE:                              OPERATIVE REPORT   PREOPERATIVE DIAGNOSIS:  Right carpal tunnel syndrome.  POSTOP DIAGNOSIS:  Right carpal tunnel syndrome.  PROCEDURE:  Right carpal tunnel release.  SURGEON:  Betha LoaKevin Makia Bossi, MD  ASSISTANT:  None.  ANESTHESIA:  General.  IV FLUIDS:  Per anesthesia flow sheet.  ESTIMATED BLOOD LOSS:  Minimal.  COMPLICATIONS:  None.  SPECIMENS:  None.  TOURNIQUET TIME:  19 minutes.  DISPOSITION:  Stable to PACU.  INDICATIONS:  Christopher Hatfield is a 33 year old right-hand-dominant male, who has had pins and needle sensation in bilateral hands for approximately 5 months.  He has had a left carpal tunnel release and is happy with results.  He wishes to have a right carpal tunnel release for management of symptoms.  He has positive nerve conduction studies.  Risks, benefits, and alternatives of surgery were discussed including risk of blood loss, infection, damage to nerves, vessels, tendons, ligaments, bone; failure of surgery; need for additional surgery, complications with wound healing, continued pain, continued carpal tunnel syndrome. He voiced any source elected to proceed.  OPERATIVE COURSE:  After being identified preoperatively by myself, the patient and I agreed upon the procedure and site of procedure.  Surgical site was marked.  The risks, benefits, and alternatives of surgery were reviewed and wished to proceed.  Surgical consent had been signed.  He was given IV vancomycin as preoperative antibiotic prophylaxis due to penicillin allergy.  He was transferred to the operating room and placed on the operating room table in supine position with the right upper extremity on arm board.  General  anesthesia was induced by the anesthesiologist.  The right upper extremity was prepped and draped in normal sterile orthopedic fashion.  Surgical pause was performed between surgeons, anesthesia, operating staff, and all were in agreement as to the patient, procedure, and site of procedure.  Tourniquet at the proximal aspect of the extremity was inflated to 250 mmHg after exsanguination of the limb with Esmarch bandage.  Incision was made over the transverse carpal ligament.  This was carried into subcutaneous tissues by spreading technique.  Bipolar electrocautery was used to obtain hemostasis.  The palmar fascia was incised sharply.  Transverse carpal ligament was identified.  It was incised sharply using the knife. It was incised distally first.  Care was taken to ensure complete decompression distally.  It was incised proximally.  Scissors were used to split the distal aspect of the volar antebrachial fascia.  The finger was placed into the wound to ensure complete decompression, which was the case.  The nerve was inspected.  The motor branch was identified and was intact.  No masses were noted.  The wound was copiously irrigated with sterile saline.  It was then closed with 4-0 nylon in a horizontal mattress fashion.  It was injected with  10 mL of 0.25% plain Marcaine in postoperative analgesia.  The wound was then dressed with sterile Xeroform, 4x4s, an ABD and wrapped with Kerlix and Ace bandage. Tourniquet was deflated at 19 minutes.  Fingertips were pink with brisk capillary refill after deflation of the tourniquet.  The operative drapes were broken down.  The patient was awoken from anesthesia safely. He was transferred back to stretcher and taken to PACU in stable condition.  I will see him back in the office in 1 week for postoperative followup.  We will give him Percocet 5/325 one to two p.o. q.6 hours p.r.n. pain, dispensed #30.     Betha Loa, MD     KK/MEDQ  D:   09/07/2013  T:  09/08/2013  Job:  [id number]

## 2013-09-11 NOTE — Addendum Note (Signed)
Addendum created 09/11/13 0640 by Lance CoonWesley Jannetta Massey, CRNA   Modules edited: Anesthesia Responsible Staff

## 2013-09-12 ENCOUNTER — Encounter (HOSPITAL_BASED_OUTPATIENT_CLINIC_OR_DEPARTMENT_OTHER): Payer: Self-pay | Admitting: Orthopedic Surgery

## 2013-09-14 ENCOUNTER — Other Ambulatory Visit: Payer: Self-pay | Admitting: Family Medicine

## 2013-09-14 NOTE — Telephone Encounter (Signed)
This was supposed to be d/c'd after surgery. Pt needs to get pain meds from surgeon.

## 2013-09-14 NOTE — Telephone Encounter (Signed)
Pt requesting medication refill. Med list indicates pt was to d/c at discharge. Last ov 06/2013 with no future appts scheduled. pls advise

## 2013-09-14 NOTE — Telephone Encounter (Signed)
Spoke to pt and advised per Dr Bedsole 

## 2013-12-07 ENCOUNTER — Encounter: Payer: Self-pay | Admitting: Family Medicine

## 2013-12-07 ENCOUNTER — Ambulatory Visit (INDEPENDENT_AMBULATORY_CARE_PROVIDER_SITE_OTHER): Payer: BC Managed Care – PPO | Admitting: Family Medicine

## 2013-12-07 VITALS — BP 110/76 | HR 62 | Temp 97.6°F | Ht 68.25 in | Wt 194.5 lb

## 2013-12-07 DIAGNOSIS — F411 Generalized anxiety disorder: Secondary | ICD-10-CM

## 2013-12-07 DIAGNOSIS — F3289 Other specified depressive episodes: Secondary | ICD-10-CM

## 2013-12-07 DIAGNOSIS — J309 Allergic rhinitis, unspecified: Secondary | ICD-10-CM

## 2013-12-07 DIAGNOSIS — F329 Major depressive disorder, single episode, unspecified: Secondary | ICD-10-CM

## 2013-12-07 MED ORDER — FLUOXETINE HCL 20 MG PO TABS: 20.0000 mg | ORAL_TABLET | Freq: Every day | ORAL | Status: DC

## 2013-12-07 MED ORDER — FLUTICASONE PROPIONATE 50 MCG/ACT NA SUSP: 2.0000 | Freq: Every day | NASAL | Status: DC

## 2013-12-07 NOTE — Progress Notes (Signed)
[address] [place] [address] Phone: [phone] Fax: [phone]  Patient ID: Christopher Hatfield MRN: [id number], DOB: Jun 27, 1981, 33 y.o. Date of Encounter: 12/07/2013  Primary Physician:  Kerby NoraAmy Bedsole, MD   Chief Complaint: Headache and Irritability   Subjective:   History of Present Illness:  Christopher Hatfield is a 33 y.o. very pleasant male patient who presents with the following:  Headaches, ? From sinuses. 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 He try some Flonase intermittently, and this does help. He did try some Claritin-D recently, and this also helped.  R Irritability. Whole family, not getting a long with his wife. Truck broke down at work. Pay issues.  Stress at work.  Snapping and being irritable at work. Family and nothing will make them feel better. Not down. Sleeping ok. Bought a house in the last year. Heat some in the last year.   Sister and mother. [place].  He doesn't feel exactly depressed, but he doesn't quite feel himself. He is working about 14 hours a day, he is not getting much physical activity at all, and he is generally stressed out about both working in his home situation. He is not getting along well with his mother and some other people in his family. This situation is none by myself and his regular doctor as well.   Past Medical History, Surgical History, Social History, Family History, Problem List, Medications, and Allergies have been reviewed and updated if relevant.  Review of Systems:  GEN: No acute illnesses, no fevers, chills. GI: No n/v/d, eating normally Pulm: No SOB Interactive and getting along well at home.  Otherwise, ROS is as per the HPI.  Objective:   Physical Examination: BP 110/76  Pulse 62  Temp(Src) 97.6 F (36.4 C) (Oral)  Ht 5' 8.25" (1.734 m)  Wt 194 lb 8 oz (88.225 kg)  BMI 29.34 kg/m2   GEN: WDWN, NAD, Non-toxic, A & O x 3 HEENT: Atraumatic, Normocephalic. Neck supple. No masses, No LAD. Sinuses  are not particularly tender. His ears do have a slight amount of fluid in them. Ears and Nose: No external deformity. CV: RRR, No M/G/R. No JVD. No thrill. No extra heart sounds. PULM: CTA B, no wheezes, crackles, rhonchi. No retractions. No resp. distress. No accessory muscle use. EXTR: No c/c/e NEURO Normal gait.  PSYCH: Normally interactive. Conversant. Not depressed or anxious appearing.  Calm demeanor.   Laboratory and Imaging Data:  Assessment & Plan:   ALLERGIC RHINITIS  Anxiety state, unspecified  DEPRESSION  I think he fell somewhere in the spectrum of acute stress reaction versus some early depression and anxiety. He is not completely feel sat, but he does have some decreased interest, and he also is quite a bit more irritable compared to normal. Some of this is situational. He has had some issues before and been on medication in the past. Multiple family members or on multiple psychiatric medications.  I think that the best idea would be for him to ideally work less, sleep better, and get more exercise. This is not realistic. We are going to try some Prozac, and do this for 3-6 months to see if it helps equalize his neuro-transmitters.  New Prescriptions   FLUOXETINE (PROZAC) 20 MG TABLET    Take 1 tablet (20 mg total) by mouth daily.   FLUTICASONE (FLONASE) 50 MCG/ACT NASAL SPRAY    Place 2 sprays into both nostrils daily.   Patient Instructions  Zyrtec-D  or Allegra-D or Claritin-D, pick 1 and do it once a day for the next week.  Afrin nasal 2 sprays twice a day for 3 days.      Signed,  Elpidio GaleaSpencer T. Jessice Madill, MD, CAQ Sports Medicine   Discontinued Medications   OXYCODONE-ACETAMINOPHEN (PERCOCET) 5-325 MG PER TABLET    1-2 tabs po q6 hours prn pain   Current Medications at Discharge:   Medication List       This list is accurate as of: 12/07/13 11:59 PM.  Always use your most recent med list.               BC HEADACHE POWDER PO  Take 1 each by mouth as  needed.     FLUoxetine 20 MG tablet  Commonly known as:  PROZAC  Take 1 tablet (20 mg total) by mouth daily.     fluticasone 50 MCG/ACT nasal spray  Commonly known as:  FLONASE  Place 2 sprays into both nostrils daily.     glucosamine-chondroitin 500-400 MG tablet  Take 2 tablets by mouth daily.     ibuprofen 200 MG tablet  Commonly known as:  ADVIL,MOTRIN  Take 400-600 mg by mouth every 6 (six) hours as needed.     vitamin B-12 100 MCG tablet  Commonly known as:  CYANOCOBALAMIN  Take 100 mcg by mouth daily.     vitamin C 1000 MG tablet  Take 1,000 mg by mouth daily.

## 2013-12-07 NOTE — Patient Instructions (Signed)
Zyrtec-D or Allegra-D or Claritin-D, pick 1 and do it once a day for the next week.  Afrin nasal 2 sprays twice a day for 3 days.

## 2013-12-07 NOTE — Progress Notes (Signed)
Pre visit review using our clinic review tool, if applicable. No additional management support is needed unless otherwise documented below in the visit note. 

## 2013-12-11 ENCOUNTER — Ambulatory Visit: Payer: BC Managed Care – PPO | Admitting: Family Medicine

## 2014-01-29 ENCOUNTER — Encounter: Payer: Self-pay | Admitting: Family Medicine

## 2014-01-29 ENCOUNTER — Ambulatory Visit (INDEPENDENT_AMBULATORY_CARE_PROVIDER_SITE_OTHER): Payer: BC Managed Care – PPO | Admitting: Family Medicine

## 2014-01-29 VITALS — BP 110/80 | HR 62 | Temp 98.1°F | Ht 68.25 in | Wt 194.2 lb

## 2014-01-29 DIAGNOSIS — F330 Major depressive disorder, recurrent, mild: Secondary | ICD-10-CM

## 2014-01-29 DIAGNOSIS — J309 Allergic rhinitis, unspecified: Secondary | ICD-10-CM

## 2014-01-29 MED ORDER — SERTRALINE HCL 50 MG PO TABS
50.0000 mg | ORAL_TABLET | Freq: Every day | ORAL | Status: DC
Start: 2014-01-29 — End: 2014-05-24

## 2014-01-29 NOTE — Patient Instructions (Addendum)
Call if interested in counselor. Stop prozac and start sertraline 50 mg daily. Take the medication at bedtime instead of in AM. Follow up in 1 month.

## 2014-01-29 NOTE — Progress Notes (Signed)
Pre visit review using our clinic review tool, if applicable. No additional management support is needed unless otherwise documented below in the visit note. 

## 2014-01-29 NOTE — Progress Notes (Signed)
   Subjective:    Patient ID: Christopher Hatfield, male    DOB: 1981/03/07, 33 y.o.   MRN: [id number]  Sinusitis Pertinent negatives include no ear pain or shortness of breath.    33 year old male presents 2 months after starting prozac 20 mg daily for irritability and situational depression He reports that he as noted some improvement in mood intially , but now in last 3 weeks he has noted some increased irritability, decreasing motivation. He is feeling hopeless. Tired. No anxiety, no panic attacks.  He feels very overwhelmed, stress at home and work. He is sleeping well at night.  No SI, HI. He has been forgetting a lot since he has been on the medication. Has not improved, has been worsening some. No sexual side effects.  He has been having issues with sinuses. He has been using afrin and claritin D prn. Using flonase 1 spray twice a week, higher use caused  nose bleed. Did not help at higher dose. Minimal improvement with allegra and zyrtec.          Review of Systems  Constitutional: Negative for fever and fatigue.  HENT: Negative for ear pain.   Eyes: Negative for pain.  Respiratory: Negative for shortness of breath and wheezing.   Cardiovascular: Negative for chest pain, palpitations and leg swelling.  Gastrointestinal: Negative for abdominal pain.       Objective:   Physical Exam  Constitutional: Vital signs are normal. He appears well-developed and well-nourished.  HENT:  Head: Normocephalic.  Right Ear: Hearing normal.  Left Ear: Hearing normal.  Nose: Mucosal edema present. No rhinorrhea. Right sinus exhibits no maxillary sinus tenderness and no frontal sinus tenderness. Left sinus exhibits no maxillary sinus tenderness and no frontal sinus tenderness.  Mouth/Throat: Oropharynx is clear and moist and mucous membranes are normal.  Neck: Trachea normal. Carotid bruit is not present. No mass and no thyromegaly present.  Cardiovascular: Normal rate, regular rhythm  and normal pulses.  Exam reveals no gallop, no distant heart sounds and no friction rub.   No murmur heard. No peripheral edema  Pulmonary/Chest: Effort normal and breath sounds normal. No respiratory distress.  Skin: Skin is warm, dry and intact. No rash noted.  Psychiatric: Thought content normal. His mood appears not anxious. His affect is blunt. His speech is not delayed. He is withdrawn. He is not agitated and not slowed. Cognition and memory are not impaired. He does not express impulsivity or inappropriate judgment. He exhibits a depressed mood. He expresses no homicidal and no suicidal ideation. He expresses no suicidal plans and no homicidal plans.          Assessment & Plan:

## 2014-01-29 NOTE — Assessment & Plan Note (Signed)
Poorly control on prozac, was improved initially with prozac. SE to prozac.  Will D/C prozac and start sertraline 50 mg daily.  Recommended counseling, he will consider. Wife has discussed marriage counseling and she has started counseling as well which may help him as well. Follow up in 1 month.

## 2014-01-29 NOTE — Assessment & Plan Note (Signed)
Moderate control using meds prn. Continue for now. MAy need to start daily claritin and add singualir at next OV if not improving.

## 2014-03-07 ENCOUNTER — Ambulatory Visit (INDEPENDENT_AMBULATORY_CARE_PROVIDER_SITE_OTHER): Payer: BC Managed Care – PPO | Admitting: Internal Medicine

## 2014-03-07 ENCOUNTER — Encounter: Payer: Self-pay | Admitting: Internal Medicine

## 2014-03-07 VITALS — BP 112/60 | HR 65 | Temp 98.2°F | Wt 192.0 lb

## 2014-03-07 DIAGNOSIS — M25562 Pain in left knee: Secondary | ICD-10-CM

## 2014-03-07 DIAGNOSIS — M25569 Pain in unspecified knee: Secondary | ICD-10-CM

## 2014-03-07 NOTE — Progress Notes (Signed)
Pre visit review using our clinic review tool, if applicable. No additional management support is needed unless otherwise documented below in the visit note. 

## 2014-03-07 NOTE — Progress Notes (Signed)
Subjective:    Patient ID: Christopher Hatfield, male    DOB: Mar 11, 1981, 33 y.o.   MRN: [id number]  HPI  Pt presents to the clinic today with c/o left knee pain. He reports that this is an ongoing problem which started a few months ago. He has a sensation that his leg feels heavy and that it needs to pop. This is intermittent. He describes the pain as dull and achy. The pain does not radiate. He has tried some ibuprofen with some relief. He denies any specific injury to the area that he is aware of.  Review of Systems      Past Medical History  Diagnosis Date  . Carpal tunnel syndrome of right wrist 08/2013    Current Outpatient Prescriptions  Medication Sig Dispense Refill  . Aspirin-Salicylamide-Caffeine (BC HEADACHE POWDER PO) Take 1 each by mouth as needed.      . fluticasone (FLONASE) 50 MCG/ACT nasal spray Place 2 sprays into both nostrils daily.  16 g  12  . ibuprofen (ADVIL,MOTRIN) 200 MG tablet Take 400-600 mg by mouth every 6 (six) hours as needed.      . sertraline (ZOLOFT) 50 MG tablet Take 1 tablet (50 mg total) by mouth daily.  30 tablet  3  . vitamin B-12 (CYANOCOBALAMIN) 100 MCG tablet Take 100 mcg by mouth daily.       No current facility-administered medications for this visit.    Allergies  Allergen Reactions  . Penicillins Swelling  . Sulfonamide Derivatives Swelling    Family History  Problem Relation Age of Onset  . Depression Mother   . Deep vein thrombosis Father   . Heart attack Maternal Grandmother   . Diabetes Paternal Grandmother   . Cancer Paternal Grandfather     PROSTATE    History   Social History  . Marital Status: Married    Spouse Name: N/A    Number of Children: 2  . 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 Smoking status: Former Games developer  . Smokeless tobacco: Former Neurosurgeon     Comment: quit smoking 6 years ago  . Alcohol Use: No  . Drug Use: No  .  Sexual Activity: Not on file   Other Topics Concern  . Not on file   Social History Narrative   Regular exercise-- no      Diet: Avoid fast food, some fruits and veggies     Constitutional: Denies fever, malaise, fatigue, headache or abrupt weight changes.  Musculoskeletal: Pt reports left knee pain. Denies decrease in range of motion, difficulty with gait, muscle pain or joint swelling.   No other specific complaints in a complete review of systems (except as listed in HPI above).  Objective:   Physical Exam  BP 112/60  Pulse 65  Temp(Src) 98.2 F (36.8 C) (Oral)  Wt 192 lb (87.091 kg)  SpO2 98% Wt Readings from Last 3 Encounters:  03/07/14 192 lb (87.091 kg)  01/29/14 194 lb 4 oz (88.111 kg)  12/07/13 194 lb 8 oz (88.225 kg)    General: Appears his stated age, well developed, well nourished in NAD. Cardiovascular: Normal rate and rhythm. S1,S2 noted.  No murmur, rubs or gallops noted.  Pulmonary/Chest: Normal effort and positive vesicular breath sounds. No respiratory distress. No wheezes, rales or ronchi noted.  Musculoskeletal:  Normal flexion and extension of the left knee. No pain with palpation of  the tendons or pes bursa. No pain with palpation of the popliteal space. Crepitus noted with McMurry. Negative Lachman's. No difficulty with gait.    BMET    Component Value Date/Time   NA 138 03/14/2013 0754   K 4.4 03/14/2013 0754   CL 105 03/14/2013 0754   CO2 29 03/14/2013 0754   GLUCOSE 90 03/14/2013 0754   BUN 11 03/14/2013 0754   CREATININE 0.9 03/14/2013 0754   CALCIUM 9.3 03/14/2013 0754   GFRNONAA 85 09/05/2007 0909   GFRAA 103 09/05/2007 0909    Lipid Panel     Component Value Date/Time   CHOL 130 03/14/2013 0754   TRIG 28.0 03/14/2013 0754   HDL 64.20 03/14/2013 0754   CHOLHDL 2 03/14/2013 0754   VLDL 5.6 03/14/2013 0754   LDLCALC 60 03/14/2013 0754    CBC    Component Value Date/Time   WBC 4.4* 03/14/2013 0754   RBC 4.80 03/14/2013 0754   HGB 13.9  09/07/2013 1253   HCT 41.7 03/14/2013 0754   PLT 261.0 03/14/2013 0754   MCV 86.7 03/14/2013 0754   MCHC 33.8 03/14/2013 0754   RDW 13.3 03/14/2013 0754   LYMPHSABS 1.8 03/14/2013 0754   MONOABS 0.4 03/14/2013 0754   EOSABS 0.4 03/14/2013 0754   BASOSABS 0.0 03/14/2013 0754    Hgb A1C No results found for this basename: HGBA1C         Assessment & Plan:   Left knee pain:  I suspect that this is arthritis I would advise him to try Aleve once or twice daily A heating pad may be helpful No need to xray today but it pain persist or worsens, advised him to come back for an xray  RTC as needed

## 2014-03-07 NOTE — Patient Instructions (Addendum)
Knee Exercises EXERCISES RANGE OF MOTION (ROM) AND STRETCHING EXERCISES These exercises may help you when beginning to rehabilitate your injury. Your symptoms may resolve with or without further involvement from your physician, physical therapist, or athletic trainer. While completing these exercises, remember:   Restoring tissue flexibility helps normal motion to return to the joints. This allows healthier, less painful movement and activity.  An effective stretch should be held for at least 30 seconds.  A stretch should never be painful. You should only feel a gentle lengthening or release in the stretched tissue. STRETCH - Knee Extension, Prone  Lie on your stomach on a firm surface, such as a bed or countertop. Place your right / left knee and leg just beyond the edge of the surface. You may wish to place a towel under the far end of your right / left thigh for comfort.  Relax your leg muscles and allow gravity to straighten your knee. Your clinician may advise you to add an ankle weight if more resistance is helpful for you.  You should feel a stretch in the back of your right / left knee. Hold this position for __________ seconds. Repeat __________ times. Complete this stretch __________ times per day. * Your physician, physical therapist, or athletic trainer may ask you to add ankle weight to enhance your stretch.  RANGE OF MOTION - Knee Flexion, Active  Lie on your back with both knees straight. (If this causes back discomfort, bend your opposite knee, placing your foot flat on the floor.)  Slowly slide your heel back toward your buttocks until you feel a gentle stretch in the front of your knee or thigh.  Hold for __________ seconds. Slowly slide your heel back to the starting position. Repeat __________ times. Complete this exercise __________ times per day.  STRETCH - Quadriceps, Prone   Lie on your stomach on a firm surface, such as a bed or padded floor.  Bend your right /  left knee and grasp your ankle. If you are unable to reach your ankle or pant leg, use a belt around your foot to lengthen your reach.  Gently pull your heel toward your buttocks. Your knee should not slide out to the side. You should feel a stretch in the front of your thigh and/or knee.  Hold this position for __________ seconds. Repeat __________ times. Complete this stretch __________ times per day.  STRETCH - Hamstrings, Supine   Lie on your back. Loop a belt or towel over the ball of your right / left foot.  Straighten your right / left knee and slowly pull on the belt to raise your leg. Do not allow the right / left knee to bend. Keep your opposite leg flat on the floor.  Raise the leg until you feel a gentle stretch behind your right / left knee or thigh. Hold this position for __________ seconds. Repeat __________ times. Complete this stretch __________ times per day.  STRENGTHENING EXERCISES These exercises may help you when beginning to rehabilitate your injury. They may resolve your symptoms with or without further involvement from your physician, physical therapist, or athletic trainer. While completing these exercises, remember:   Muscles can gain both the endurance and the strength needed for everyday activities through controlled exercises.  Complete these exercises as instructed by your physician, physical therapist, or athletic trainer. Progress the resistance and repetitions only as guided.  You may experience muscle soreness or fatigue, but the pain or discomfort you are trying to eliminate   should never worsen during these exercises. If this pain does worsen, stop and make certain you are following the directions exactly. If the pain is still present after adjustments, discontinue the exercise until you can discuss the trouble with your clinician. STRENGTH - Quadriceps, Isometrics  Lie on your back with your right / left leg extended and your opposite knee  bent.  Gradually tense the muscles in the front of your right / left thigh. You should see either your knee cap slide up toward your hip or increased dimpling just above the knee. This motion will push the back of the knee down toward the floor/mat/bed on which you are lying.  Hold the muscle as tight as you can without increasing your pain for __________ seconds.  Relax the muscles slowly and completely in between each repetition. Repeat __________ times. Complete this exercise __________ times per day.  STRENGTH - Quadriceps, Short Arcs   Lie on your back. Place a __________ inch towel roll under your knee so that the knee slightly bends.  Raise only your lower leg by tightening the muscles in the front of your thigh. Do not allow your thigh to rise.  Hold this position for __________ seconds. Repeat __________ times. Complete this exercise __________ times per day.  OPTIONAL ANKLE WEIGHTS: Begin with ____________________, but DO NOT exceed ____________________. Increase in 1 pound/0.5 kilogram increments.  STRENGTH - Quadriceps, Straight Leg Raises  Quality counts! Watch for signs that the quadriceps muscle is working to insure you are strengthening the correct muscles and not "cheating" by substituting with healthier muscles.  Lay on your back with your right / left leg extended and your opposite knee bent.  Tense the muscles in the front of your right / left thigh. You should see either your knee cap slide up or increased dimpling just above the knee. Your thigh may even quiver.  Tighten these muscles even more and raise your leg 4 to 6 inches off the floor. Hold for __________ seconds.  Keeping these muscles tense, lower your leg.  Relax the muscles slowly and completely in between each repetition. Repeat __________ times. Complete this exercise __________ times per day.  STRENGTH - Hamstring, Curls  Lay on your stomach with your legs extended. (If you lay on a bed, your feet  may hang over the edge.)  Tighten the muscles in the back of your thigh to bend your right / left knee up to 90 degrees. Keep your hips flat on the bed/floor.  Hold this position for __________ seconds.  Slowly lower your leg back to the starting position. Repeat __________ times. Complete this exercise __________ times per day.  OPTIONAL ANKLE WEIGHTS: Begin with ____________________, but DO NOT exceed ____________________. Increase in 1 pound/0.5 kilogram increments.  STRENGTH - Quadriceps, Squats  Stand in a door frame so that your feet and knees are in line with the frame.  Use your hands for balance, not support, on the frame.  Slowly lower your weight, bending at the hips and knees. Keep your lower legs upright so that they are parallel with the door frame. Squat only within the range that does not increase your knee pain. Never let your hips drop below your knees.  Slowly return upright, pushing with your legs, not pulling with your hands. Repeat __________ times. Complete this exercise __________ times per day.  STRENGTH - Quadriceps, Wall Slides  Follow guidelines for form closely. Increased knee pain often results from poorly placed feet or knees.  Lean   against a smooth wall or door and walk your feet out 18-24 inches. Place your feet hip-width apart.  Slowly slide down the wall or door until your knees bend __________ degrees.* Keep your knees over your heels, not your toes, and in line with your hips, not falling to either side.  Hold for __________ seconds. Stand up to rest for __________ seconds in between each repetition. Repeat __________ times. Complete this exercise __________ times per day. * Your physician, physical therapist, or athletic trainer will alter this angle based on your symptoms and progress. Document Released: 04/28/2005 Document Revised: 10/29/2013 Document Reviewed: 09/26/2008 Exit[employer] Patient Information 2015 [employer]. This information is not  intended to replace advice given to you by your health care provider. Make sure you discuss any questions you have with your health care provider.  

## 2014-03-21 ENCOUNTER — Ambulatory Visit (INDEPENDENT_AMBULATORY_CARE_PROVIDER_SITE_OTHER): Payer: BC Managed Care – PPO | Admitting: Family Medicine

## 2014-03-21 ENCOUNTER — Encounter: Payer: Self-pay | Admitting: Family Medicine

## 2014-03-21 VITALS — BP 116/80 | HR 55 | Temp 98.1°F | Ht 68.25 in | Wt 194.0 lb

## 2014-03-21 DIAGNOSIS — M545 Low back pain, unspecified: Secondary | ICD-10-CM

## 2014-03-21 DIAGNOSIS — M542 Cervicalgia: Secondary | ICD-10-CM

## 2014-03-21 MED ORDER — DICLOFENAC SODIUM 75 MG PO TBEC: 75.0000 mg | DELAYED_RELEASE_TABLET | Freq: Two times a day (BID) | ORAL | Status: DC

## 2014-03-21 MED ORDER — CYCLOBENZAPRINE HCL 10 MG PO TABS: 10.0000 mg | ORAL_TABLET | Freq: Every evening | ORAL | Status: DC | PRN

## 2014-03-21 NOTE — Progress Notes (Signed)
Pre visit review using our clinic review tool, if applicable. No additional management support is needed unless otherwise documented below in the visit note. 

## 2014-03-21 NOTE — Progress Notes (Signed)
   Subjective:    Patient ID: Christopher Hatfield, male    DOB: 01/20/1981, 33 y.o.   MRN: [id number]  Back Pain Pertinent negatives include no chest pain or fever.    33 year old male presents with new onset neck pain, and low back pain x 2 weeks. Popping and cracking in neck. Ache and stiffness in neck. Low back pain is central occ in bilateral low back. No radiating pain. Occ keeping him up at night. No weakness, no new numbness in upper or lower ext.  Sitting in truck hurts more. Occ leaning forward cause sharp pain.   He has worn back brace and tried sitting up striaghter more which helps low back. Has been limiting activity. He has tried ibuprofen 800 mg and aleve.   No falls or known injuries. No back surgeries.    Review of Systems  Constitutional: Negative for fever and fatigue.  HENT: Negative for ear pain.   Eyes: Negative for pain.  Respiratory: Negative for cough and shortness of breath.   Cardiovascular: Negative for chest pain.  Musculoskeletal: Positive for back pain.       Objective:   Physical Exam  Musculoskeletal:       Cervical back: He exhibits decreased range of motion and tenderness. He exhibits no bony tenderness.       Thoracic back: Normal.       Lumbar back: He exhibits tenderness. He exhibits normal range of motion and no swelling.  Neg spurling  neg SLR, neg faber's   Mild ttp over paraspinous muscles B  Neurological: He has normal strength. He displays no atrophy. No sensory deficit. He exhibits normal muscle tone. He displays no seizure activity. Gait normal.          Assessment & Plan:

## 2014-03-21 NOTE — Patient Instructions (Signed)
Start diclofenac twice daily. Can use muscle relaxant at night. Start home PT. Heat on neck and low back. Limit heavy lifting >10 lbs, repetitive twisting of low back.

## 2014-03-21 NOTE — Assessment & Plan Note (Signed)
Treat with NSAIDs, heat, PT and muscle relaxant. If not improving consider formal PT and imaging.

## 2014-03-21 NOTE — Assessment & Plan Note (Signed)
No sign of radiculopathy. Most likely muscle strain and spasm.  Treat with NSAIDs, heat, PT and muscle relaxant.

## 2014-04-12 ENCOUNTER — Other Ambulatory Visit: Payer: Self-pay

## 2014-04-12 ENCOUNTER — Telehealth: Payer: Self-pay

## 2014-04-12 MED ORDER — TRAMADOL HCL 50 MG PO TABS: 50.0000 mg | ORAL_TABLET | Freq: Three times a day (TID) | ORAL | Status: DC | PRN

## 2014-04-12 NOTE — Telephone Encounter (Signed)
Christopher Hatfield notified prescription for Tramadol has been called to [facility] [place].  He states the chiropractor he saw is starting PT with him next week for six weeks.  Advised to follow up for further evaluation if not improving.

## 2014-04-12 NOTE — Telephone Encounter (Signed)
Pt left v/m; pt saw Dr Ermalene SearingBedsole on 03/25/14; aleve, ice not helping back pain. Pt saw Chiropractor and was told had pinched nerve. Pt request med to [facility] to help back pain.pt request cb.

## 2014-04-12 NOTE — Telephone Encounter (Signed)
Recommend referral to PT for further treatment.  Let me know if pt agreeable. Will send in tramadol for pain. If not improving follow up for futher eval and treat.

## 2014-04-30 ENCOUNTER — Other Ambulatory Visit: Payer: Self-pay | Admitting: Family Medicine

## 2014-04-30 NOTE — Telephone Encounter (Signed)
Last office visit 03/21/2014  Last refilled 04/12/2014 for #30 with no refills.  Ok to refill?

## 2014-04-30 NOTE — Telephone Encounter (Signed)
Called to [facility]. 

## 2014-05-13 ENCOUNTER — Ambulatory Visit (INDEPENDENT_AMBULATORY_CARE_PROVIDER_SITE_OTHER): Payer: BC Managed Care – PPO | Admitting: Family Medicine

## 2014-05-13 VITALS — BP 110/78 | HR 61 | Temp 97.9°F | Ht 68.25 in | Wt 198.5 lb

## 2014-05-13 DIAGNOSIS — M545 Low back pain, unspecified: Secondary | ICD-10-CM

## 2014-05-13 DIAGNOSIS — F330 Major depressive disorder, recurrent, mild: Secondary | ICD-10-CM

## 2014-05-13 MED ORDER — PREDNISONE 20 MG PO TABS: ORAL_TABLET | ORAL | Status: DC

## 2014-05-13 MED ORDER — HYDROCODONE-ACETAMINOPHEN 5-325 MG PO TABS: 1.0000 | ORAL_TABLET | Freq: Four times a day (QID) | ORAL | Status: DC | PRN

## 2014-05-13 NOTE — Progress Notes (Signed)
Pre visit review using our clinic review tool, if applicable. No additional management support is needed unless otherwise documented below in the visit note. 

## 2014-05-13 NOTE — Progress Notes (Signed)
Dr. Karleen HampshireSpencer T. Shanta Dorvil, MD, CAQ [facility] [address] [place], [id number] Phone: [phone] Fax: [phone]  05/13/2014  Patient: Christopher Hatfield, MRN: [id number], DOB: 1980-07-12, 33 y.o.  Primary Physician:  Kerby NoraAmy Bedsole, MD  Chief Complaint: Back Pain  Subjective:   Christopher Hatfield is a 33 y.o. very pleasant male patient who presents with the following: Back Pain  ongoing for approximately: 2 mo The patient has had back pain before. The back pain is localized into the lumbar spine area. They also describe L sided radiculopathy.  No bowel or bladder incontinence. No focal weakness. Prior interventions: NSAIDS, flexeril, heat Physical therapy: No Chiropractic manipulations: YES Acupuncture: No Osteopathic manipulation: No Heat or cold: Minimal effect  He also would like to go off of his antidepressants. He feels good now, but had trouble coming up with a taper  Past Medical History, Surgical History, Family History, Medications, Allergies have been reviewed and updated if relevant.  GEN: No fevers, chills. Nontoxic. Primarily MSK c/o today. MSK: Detailed in the HPI GI: tolerating PO intake without difficulty Neuro: As above  Otherwise the pertinent positives of the ROS are noted above.    Objective:   Blood pressure 110/78, pulse 61, temperature 97.9 F (36.6 C), temperature source Oral, height 5' 8.25" (1.734 m), weight 198 lb 8 oz (90.039 kg).  Gen: Well-developed,well-nourished,in no acute distress; alert,appropriate and cooperative throughout examination HEENT: Normocephalic and atraumatic without obvious abnormalities.  Ears, externally no deformities Pulm: Breathing comfortably in no respiratory distress Range of motion at  the waist: Flexion, rotation and lateral bending: relatively preserved.  Some pain with extension and bending  No echymosis or edema Rises to examination table with no difficulty Gait:  minimally antalgic  Inspection/Deformity: No abnormality Paraspinus T:  Mild tension from L1-S1 bilaterally  B Ankle Dorsiflexion (L5,4): 5/5 B Great Toe Dorsiflexion (L5,4): 5/5 Heel Walk (L5): WNL Toe Walk (S1): WNL Rise/Squat (L4): WNL, mild pain  SENSORY B Medial Foot (L4): WNL B Dorsum (L5): WNL B Lateral (S1): WNL Light Touch: WNL Pinprick: WNL  REFLEXES Knee (L4): 2+ Ankle (S1): 2+  B SLR, seated: neg B SLR, supine: neg B FABER: neg B Reverse FABER: neg B Greater Troch: NT B Log Roll: neg B Stork: NT B Sciatic Notch: NT  Radiology: No results found.  Assessment and Plan:   Acute low back pain  Depression, major, recurrent, mild  Anatomy reviewed. Conservative algorithms for acute back pain generally begin with the following: NSAIDS, Muscle Relaxants, Mild pain medication - added steroids, some vicodin prn. Caution reviewed.   Start with medications, core rehab, and progress from there following low back pain algorithm. No red flags are present.  I gave the patient a detailed rehabilitation program from [facility] that relates to their condition with range of motion exercises and strengthening - mostly stretching.   Taper off SSRI.  1/2 tablet for 2 weeks.  Then half a tablet every other day for 1 week  Follow-up: No Follow-up on file.  New Prescriptions   HYDROCODONE-ACETAMINOPHEN (NORCO/VICODIN) 5-325 MG PER TABLET    Take 1 tablet by mouth every 6 (six) hours as needed for moderate pain.   PREDNISONE (DELTASONE) 20 MG TABLET    2 tabs po for 5 days, then 1 po for 5 days   No orders of the defined types were placed in this encounter.    Signed,  Elpidio GaleaSpencer T. Halli Equihua, MD  Patient's Medications  New Prescriptions   HYDROCODONE-ACETAMINOPHEN (NORCO/VICODIN) 5-325 MG PER TABLET    Take 1 tablet by mouth every 6 (six) hours as needed for moderate pain.   PREDNISONE (DELTASONE) 20 MG TABLET    2 tabs po for 5 days, then 1 po for 5 days    Previous Medications   FLUTICASONE (FLONASE) 50 MCG/ACT NASAL SPRAY    Place 2 sprays into both nostrils daily.   SERTRALINE (ZOLOFT) 50 MG TABLET    Take 1 tablet (50 mg total) by mouth daily.   TRAMADOL (ULTRAM) 50 MG TABLET    TAKE 1 TABLET BY MOUTH EVERY 8 HOURS AS NEEDED FOR PAIN  Modified Medications   No medications on file  Discontinued Medications   ASPIRIN-SALICYLAMIDE-CAFFEINE (BC HEADACHE POWDER PO)    Take 1 each by mouth as needed.   CYCLOBENZAPRINE (FLEXERIL) 10 MG TABLET    Take 1 tablet (10 mg total) by mouth at bedtime as needed for muscle spasms.   DICLOFENAC (VOLTAREN) 75 MG EC TABLET    Take 1 tablet (75 mg total) by mouth 2 (two) times daily.   VITAMIN B-12 (CYANOCOBALAMIN) 100 MCG TABLET    Take 100 mcg by mouth daily.

## 2014-05-14 ENCOUNTER — Encounter: Payer: Self-pay | Admitting: Family Medicine

## 2014-05-22 ENCOUNTER — Telehealth: Payer: Self-pay

## 2014-05-22 NOTE — Telephone Encounter (Signed)
He really has had minimal work-up, evaluation, and treatment. Typically reevaluation appropriate in this case. Recommend basic films, more conservative management.   I can see on Friday or when back in town

## 2014-05-22 NOTE — Telephone Encounter (Signed)
Tim notified as instructed per Dr. Patsy Lageropland.  Appointment scheduled for Friday 05/24/2014 at 8:15am.

## 2014-05-22 NOTE — Telephone Encounter (Signed)
Left message for Christopher Hatfield to return my call.

## 2014-05-22 NOTE — Telephone Encounter (Signed)
Pt left v/m; pt was seen 05/13/14 with back pain and pain is no better; pt does not think hydrocodone is as effective as tramadol was for pain. Pt request referral to back specialist.Please advise.

## 2014-05-24 ENCOUNTER — Ambulatory Visit (INDEPENDENT_AMBULATORY_CARE_PROVIDER_SITE_OTHER)
Admission: RE | Admit: 2014-05-24 | Discharge: 2014-05-24 | Disposition: A | Discharge status: Home / Self Care | Payer: BC Managed Care – PPO | Source: Ambulatory Visit | Attending: Family Medicine | Admitting: Family Medicine

## 2014-05-24 ENCOUNTER — Ambulatory Visit (INDEPENDENT_AMBULATORY_CARE_PROVIDER_SITE_OTHER): Payer: BC Managed Care – PPO | Admitting: Family Medicine

## 2014-05-24 ENCOUNTER — Encounter: Payer: Self-pay | Admitting: Family Medicine

## 2014-05-24 VITALS — BP 112/70 | HR 65 | Temp 97.6°F | Ht 68.25 in | Wt 197.5 lb

## 2014-05-24 DIAGNOSIS — M5417 Radiculopathy, lumbosacral region: Secondary | ICD-10-CM

## 2014-05-24 DIAGNOSIS — M5416 Radiculopathy, lumbar region: Secondary | ICD-10-CM

## 2014-05-24 MED ORDER — PREGABALIN 75 MG PO CAPS: 75.0000 mg | ORAL_CAPSULE | Freq: Two times a day (BID) | ORAL | Status: DC

## 2014-05-24 MED ORDER — TRAMADOL HCL 50 MG PO TABS: 50.0000 mg | ORAL_TABLET | Freq: Three times a day (TID) | ORAL | Status: DC | PRN

## 2014-05-24 NOTE — Progress Notes (Signed)
Dr. Karleen HampshireSpencer T. Jearl Soto, MD, CAQ Sports [facility] [address] [place], [address] Phone: [phone] Fax: [phone]  05/24/2014  Patient: Christopher Sectionimothy J Carrara, MRN: [id number], DOB: 1980-10-25, 33 y.o.  Primary Physician:  Kerby NoraAmy Bedsole, MD  Chief Complaint: Follow-up  Subjective:   Christopher Hatfield is a 33 y.o. very pleasant male patient who presents with the following: Back Pain  F/u LBP: The patient has now had low back pain ongoing for 3 months with ongoing radiculopathy on the LEFT side without numbness, no change in strength.  Last time I gave him a 10 day prednisone taper which did not help at all.  He is also been getting some chiropractic manipulation, and he already has a TENS unit.  He has been doing some stretches essentially every day that I reviewed with him.  Going to chiropractor, may be a little better. Constant squeezing his spine at the bottom. Getting some manipulation. Nothing helping - has a TENS unit. Mainly now all in the low back. Wearing a back brace.   Pain down the left leg is getting a little better.   05/13/2014 Last OV with Hannah BeatSpencer Lamberto Dinapoli, MD  ongoing for approximately: 2 mo The patient has had back pain before. The back pain is localized into the lumbar spine area. They also describe L sided radiculopathy.  No bowel or bladder incontinence. No focal weakness. Prior interventions: NSAIDS, flexeril, heat Physical therapy: No Chiropractic manipulations: YES Acupuncture: No Osteopathic manipulation: No Heat or cold: Minimal effect  He also would like to go off of his antidepressants. He feels good now, but had trouble coming up with a taper  Past Medical History, Surgical History, Family History, Medications, Allergies have been reviewed and updated if relevant.  GEN: No fevers, chills. Nontoxic. Primarily MSK c/o today. MSK: Detailed in the HPI GI: tolerating PO intake without difficulty Neuro: As above    Otherwise the pertinent positives of the ROS are noted above.    Objective:   Blood pressure 112/70, pulse 65, temperature 97.6 F (36.4 C), temperature source Oral, height 5' 8.25" (1.734 m), weight 197 lb 8 oz (89.585 kg), SpO2 99 %.  Gen: Well-developed,well-nourished,in no acute distress; alert,appropriate and cooperative throughout examination HEENT: Normocephalic and atraumatic without obvious abnormalities.  Ears, externally no deformities Pulm: Breathing comfortably in no respiratory distress Range of motion at  the waist: Flexion, rotation and lateral bending: relatively preserved.  Some pain with extension and bending  No echymosis or edema Rises to examination table with no difficulty Gait: minimally antalgic  Inspection/Deformity: No abnormality Paraspinus T:  Mild tension from L1-S1 bilaterally  B Ankle Dorsiflexion (L5,4): 5/5 B Great Toe Dorsiflexion (L5,4): 5/5 Heel Walk (L5): WNL Toe Walk (S1): WNL Rise/Squat (L4): WNL, mild pain  SENSORY B Medial Foot (L4): WNL B Dorsum (L5): WNL B Lateral (S1): WNL Light Touch: WNL Pinprick: WNL  REFLEXES Knee (L4): 2+ Ankle (S1): 2+  B SLR, seated: neg B SLR, supine: neg B FABER: neg B Reverse FABER: neg B Greater Troch: NT B Log Roll: neg B Stork: NT B Sciatic Notch: NT  Radiology: Dg Lumbar Spine Complete  05/24/2014   CLINICAL DATA:  ongoing back pain of several months, no trauma  EXAM: LUMBAR SPINE - COMPLETE 4+ VIEW  COMPARISON:  None.  FINDINGS: There is no evidence of lumbar spine fracture. Alignment is normal. Intervertebral disc spaces are maintained.  IMPRESSION: Negative.   Electronically Signed   By: Reuel Boomaniel  Deanne CofferHassell M.D.   On: 05/24/2014 10:07    Assessment and Plan:   Lumbar back pain with radiculopathy affecting left lower extremity - Plan: DG Lumbar Spine Complete, MR Lumbar Spine Wo Contrast  Ongoing lumbar radiculopathy down the LEFT side with failure to improve with traditional  conservative management 3 months.  Trial of neuropathic pain medication.  Obtain an magnetic resonance imaging of the lumbar spine without contrast to evaluate for nerve encroachment, disc herniation, and foraminal impingement on the LEFT side. ESI may be appropriate in this case.   Follow-up: No Follow-up on file.  New Prescriptions   PREGABALIN (LYRICA) 75 MG CAPSULE    Take 1 capsule (75 mg total) by mouth 2 (two) times daily.   Orders Placed This Encounter  Procedures  . DG Lumbar Spine Complete  . MR Lumbar Spine Wo Contrast    Signed,  Huntley Knoop T. Areesha Dehaven, MD   Patient's Medications  New Prescriptions   PREGABALIN (LYRICA) 75 MG CAPSULE    Take 1 capsule (75 mg total) by mouth 2 (two) times daily.  Previous Medications   FLUTICASONE (FLONASE) 50 MCG/ACT NASAL SPRAY    Place 2 sprays into both nostrils daily.  Modified Medications   Modified Medication Previous Medication   TRAMADOL (ULTRAM) 50 MG TABLET traMADol (ULTRAM) 50 MG tablet      Take 1 tablet (50 mg total) by mouth every 8 (eight) hours as needed. for pain    TAKE 1 TABLET BY MOUTH EVERY 8 HOURS AS NEEDED FOR PAIN  Discontinued Medications   HYDROCODONE-ACETAMINOPHEN (NORCO/VICODIN) 5-325 MG PER TABLET    Take 1 tablet by mouth every 6 (six) hours as needed for moderate pain.   PREDNISONE (DELTASONE) 20 MG TABLET    2 tabs po for 5 days, then 1 po for 5 days   SERTRALINE (ZOLOFT) 50 MG TABLET    Take 1 tablet (50 mg total) by mouth daily.

## 2014-05-24 NOTE — Progress Notes (Signed)
Pre visit review using our clinic review tool, if applicable. No additional management support is needed unless otherwise documented below in the visit note. 

## 2014-05-26 ENCOUNTER — Other Ambulatory Visit: Payer: Self-pay | Admitting: Family Medicine

## 2014-05-26 NOTE — Telephone Encounter (Signed)
Last office visit 05/24/2014 with Dr. Patsy Lageropland.  Sertraline not on current medication list.  Ok to refill?

## 2014-05-31 ENCOUNTER — Ambulatory Visit (HOSPITAL_COMMUNITY)
Admission: RE | Admit: 2014-05-31 | Discharge: 2014-05-31 | Disposition: A | Discharge status: Home / Self Care | Payer: BC Managed Care – PPO | Source: Ambulatory Visit | Attending: Family Medicine | Admitting: Family Medicine

## 2014-05-31 DIAGNOSIS — M545 Low back pain: Secondary | ICD-10-CM | POA: Diagnosis present

## 2014-05-31 DIAGNOSIS — M47896 Other spondylosis, lumbar region: Secondary | ICD-10-CM | POA: Diagnosis not present

## 2014-05-31 DIAGNOSIS — M5416 Radiculopathy, lumbar region: Secondary | ICD-10-CM

## 2014-06-04 ENCOUNTER — Other Ambulatory Visit: Payer: Self-pay | Admitting: Family Medicine

## 2014-06-04 ENCOUNTER — Telehealth: Payer: Self-pay

## 2014-06-04 DIAGNOSIS — M5416 Radiculopathy, lumbar region: Secondary | ICD-10-CM

## 2014-06-04 NOTE — Telephone Encounter (Signed)
Pt left vm requesting cb [phone] about MRI of back.

## 2014-06-05 NOTE — Telephone Encounter (Signed)
MRI results discussed with Christopher Hatfield.  Advised Christopher Hatfield would be calling him once she has his appointment scheduled with the spine specialist.

## 2014-06-05 NOTE — Telephone Encounter (Signed)
Reviewed on mychart. Please review with him.

## 2014-06-24 ENCOUNTER — Encounter: Payer: Self-pay | Admitting: Family Medicine

## 2014-07-11 ENCOUNTER — Other Ambulatory Visit: Payer: Self-pay | Admitting: Family Medicine

## 2014-07-11 NOTE — Telephone Encounter (Signed)
Called to [facility]  

## 2014-07-11 NOTE — Telephone Encounter (Signed)
Last office visit 05/24/2014 with Dr. Patsy Lageropland.  Last refilled 05/24/2014 for #40 with 3 refills.  Ok to refill?

## 2014-07-26 ENCOUNTER — Other Ambulatory Visit: Payer: Self-pay | Admitting: Family Medicine

## 2014-07-26 NOTE — Telephone Encounter (Signed)
Last office visit 05/24/2014 with Dr. Patsy Lageropland.  Last refilled 07/11/2014 for #60.  Ok to refill?

## 2014-07-26 NOTE — Telephone Encounter (Signed)
Spoke with [facility].  He is not completely out of his Tramadol yet.  He states he knows it takes a few days to get approval for refill so that is why he called it in.  He is scheduled to see Dr. Ermalene SearingBedsole on Monday 07/29/2014.

## 2014-07-26 NOTE — Telephone Encounter (Signed)
Appears he has used 60 in 15 days. Needs re-eval if pain control is so poor. Please call pt to determine what situation is. Denied until more info or seen.

## 2014-07-29 ENCOUNTER — Ambulatory Visit (INDEPENDENT_AMBULATORY_CARE_PROVIDER_SITE_OTHER): Payer: BLUE CROSS/BLUE SHIELD | Admitting: Family Medicine

## 2014-07-29 ENCOUNTER — Encounter: Payer: Self-pay | Admitting: Family Medicine

## 2014-07-29 ENCOUNTER — Encounter: Payer: Self-pay | Admitting: Radiology

## 2014-07-29 VITALS — BP 100/74 | HR 62 | Temp 98.3°F | Ht 68.25 in | Wt 198.2 lb

## 2014-07-29 DIAGNOSIS — M5410 Radiculopathy, site unspecified: Secondary | ICD-10-CM

## 2014-07-29 DIAGNOSIS — G8929 Other chronic pain: Secondary | ICD-10-CM

## 2014-07-29 DIAGNOSIS — M5416 Radiculopathy, lumbar region: Principal | ICD-10-CM

## 2014-07-29 MED ORDER — TRAMADOL HCL 50 MG PO TABS: ORAL_TABLET | ORAL | Status: DC

## 2014-07-29 MED ORDER — GABAPENTIN 100 MG PO CAPS: 100.0000 mg | ORAL_CAPSULE | Freq: Three times a day (TID) | ORAL | Status: DC

## 2014-07-29 NOTE — Assessment & Plan Note (Signed)
Lyrica to expensive. Will try neurontin, start low. ESI minimally helpful, per pt too expensive t return to see Dr. Ethelene Halamos. Refer to PT if covered by insurance. Pt will look into. Continue home stretching fpor now. Use tramadol 1 tab three times daily, no more without contacting us. Try to us as little as possible over time. Follow up in 3 months.

## 2014-07-29 NOTE — Progress Notes (Signed)
   Subjective:    Patient ID: Christopher Hatfield, male    DOB: 1980-07-31, 34 y.o.   MRN: [id number]  HPI  34 year old male presents for follow up. For F/u LBP: The patient has now had low back pain ongoing for 5 months with ongoing radiculopathy on the LEFT side without numbness, no change in strength. Last time I gave him a 10 day prednisone taper which did not help at all. He is also been getting some chiropractic manipulation, and he already has a TENS unit. He has been doing some stretches essentially every day that I reviewed with him.  Going to chiropractor, may be a little better. Constant squeezing his spine at the bottom. Getting some manipulation. Nothing helping - has a TENS unit. Mainly now all in the low back. Wearing a back brace.   Saw Dr. Salena Saner on 05/24/2014 for follow up: Started on lyrica 75 mg BID, continued on tramadol. He did not feel that that dose was not helping. I was expensive.   MRI was obtained showing: 1. No high-grade spinal stenosis or focal disc herniation. 2. 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 At L3-4, extraforaminal right L3 nerve root encroachment is possible by asymmetric disc bulging laterally. No other evidence of nerve root encroachment. 3. No acute osseous findings or malalignment   Today he reports:  ESI did not help much. He has been having continued pain especially when on feet.  He has been using 1 tab three times a day in last month. He finds if he takes this regularly he has much better control of pain. No SE to tramadol.     Review of Systems  Constitutional: Negative for fever and fatigue.  HENT: Negative for ear pain.   Eyes: Negative for pain.  Respiratory: Negative for shortness of breath.   Cardiovascular: Negative for chest pain.       Objective:   Physical Exam  Constitutional: Vital signs are normal. He appears well-developed and well-nourished.  HENT:  Head: Normocephalic.    Right Ear: Hearing normal.  Left Ear: Hearing normal.  Nose: Nose normal.  Mouth/Throat: Oropharynx is clear and moist and mucous membranes are normal.  Neck: Trachea normal. Carotid bruit is not present. No thyroid mass and no thyromegaly present.  Cardiovascular: Normal rate, regular rhythm and normal pulses.  Exam reveals no gallop, no distant heart sounds and no friction rub.   No murmur heard. No peripheral edema  Pulmonary/Chest: Effort normal and breath sounds normal. No respiratory distress.  Musculoskeletal:       Thoracic back: He exhibits tenderness. He exhibits normal range of motion, no bony tenderness, no swelling and no deformity.       Lumbar back: He exhibits decreased range of motion. He exhibits no tenderness, no bony tenderness, no swelling, no edema, no pain and no spasm.  Neg SLR, neg Faber's  Skin: Skin is warm, dry and intact. No rash noted.  Psychiatric: He has a normal mood and affect. His speech is normal and behavior is normal. Thought content normal.          Assessment & Plan:

## 2014-07-29 NOTE — Patient Instructions (Signed)
Look into physical therapy coverage with insurance. Start Neurontin at bedtime. Use tramadol only three times a day. Call in 1 week with update of how neurontin is helping or SE. We can increase med over the phone. Stop lab for urine drug screen on way out.

## 2014-07-29 NOTE — Progress Notes (Signed)
Pre visit review using our clinic review tool, if applicable. No additional management support is needed unless otherwise documented below in the visit note. 

## 2014-08-05 ENCOUNTER — Telehealth: Payer: Self-pay

## 2014-08-05 NOTE — Telephone Encounter (Signed)
Pt left v/m ; pt was seen 07/29/14 and neurontin 100mg  taking one tid was started; pt said neurontin does not give pt any side effects but pt cannot tell helping a lot and pt wants to know if dosage can be increased. Pt request cb.Please advise.

## 2014-08-06 NOTE — Telephone Encounter (Signed)
Increase to 2 Tabs  (200 mg) at bedtime x few days then gradually increase to 200 mg TID. Send in new rx for this, 30 day supply, 3 RF.

## 2014-08-06 NOTE — Telephone Encounter (Signed)
Spoke with Christopher Hatfield.  He states he has only been taking one tablet a day only at bedtime.  Advised to increase 100 mg three times a day to see how he does on that dose.  If not helping, advised to call back and we can increase his dose further

## 2014-08-06 NOTE — Telephone Encounter (Signed)
Noted  

## 2014-08-09 ENCOUNTER — Encounter: Payer: Self-pay | Admitting: Family Medicine

## 2014-08-12 NOTE — Telephone Encounter (Addendum)
Pt left v/m; pt increased gabapentin as instructed and pt not having any adverse side effects; but cannot see any improvement in pain. Pt request cb [phone]. Pt wanted to know if could increase dose again or what to do.Please advise.[facility]

## 2014-08-13 NOTE — Telephone Encounter (Signed)
Mr. Cliffton AstersWhite notified as instructed by telephone.

## 2014-08-13 NOTE — Telephone Encounter (Signed)
He can increase from 100 mg TID to 100 mg AM, mid day and 200 ( then in 1 week  Increase to 300 mg )at bedtime.

## 2014-08-20 ENCOUNTER — Other Ambulatory Visit: Payer: Self-pay

## 2014-08-20 MED ORDER — GABAPENTIN 100 MG PO CAPS: 100.0000 mg | ORAL_CAPSULE | Freq: Three times a day (TID) | ORAL | Status: DC

## 2014-08-20 NOTE — Telephone Encounter (Signed)
Pt left v/m requesting up date on quantity from # 30 to # 90 since gabapentin dosage changed.Please advise. [facility].

## 2014-08-22 ENCOUNTER — Encounter: Payer: Self-pay | Admitting: Family Medicine

## 2014-08-22 ENCOUNTER — Ambulatory Visit (INDEPENDENT_AMBULATORY_CARE_PROVIDER_SITE_OTHER): Payer: BLUE CROSS/BLUE SHIELD | Admitting: Family Medicine

## 2014-08-22 ENCOUNTER — Telehealth: Payer: Self-pay | Admitting: Family Medicine

## 2014-08-22 VITALS — BP 118/76 | HR 82 | Temp 98.0°F | Wt 194.0 lb

## 2014-08-22 DIAGNOSIS — M5416 Radiculopathy, lumbar region: Secondary | ICD-10-CM

## 2014-08-22 DIAGNOSIS — R112 Nausea with vomiting, unspecified: Secondary | ICD-10-CM | POA: Insufficient documentation

## 2014-08-22 DIAGNOSIS — M5410 Radiculopathy, site unspecified: Secondary | ICD-10-CM

## 2014-08-22 DIAGNOSIS — G8929 Other chronic pain: Secondary | ICD-10-CM

## 2014-08-22 DIAGNOSIS — R197 Diarrhea, unspecified: Secondary | ICD-10-CM

## 2014-08-22 MED ORDER — CYCLOBENZAPRINE HCL 10 MG PO TABS: 10.0000 mg | ORAL_TABLET | Freq: Every evening | ORAL | Status: DC | PRN

## 2014-08-22 MED ORDER — DICLOFENAC SODIUM 75 MG PO TBEC: 75.0000 mg | DELAYED_RELEASE_TABLET | Freq: Two times a day (BID) | ORAL | Status: DC

## 2014-08-22 MED ORDER — DEXAMETHASONE SODIUM PHOSPHATE 10 MG/ML IJ SOLN
10.0000 mg | Freq: Once | INTRAMUSCULAR | Status: AC
  Administered 2014-08-22: 10 mg via INTRAMUSCULAR

## 2014-08-22 NOTE — Assessment & Plan Note (Signed)
Recent vomiting/diarrhea anticipate from food poisoning after eating potato salad last night. Now largely resolved. Pt declines anti emetic.

## 2014-08-22 NOTE — Patient Instructions (Addendum)
I think you did have food poisoning - this should get better with time. I think vomiting/diarrheal episode caused flare of back pain - treat with muscle relaxant flexeril and continue tramadol and gabapentin. After 1-2 days (as long as stomach is feeling better) may try anti inflammatory course. Update us with effect in next 1-2 weeks

## 2014-08-22 NOTE — Telephone Encounter (Signed)
Agreed -

## 2014-08-22 NOTE — Progress Notes (Signed)
BP 118/76 mmHg  Pulse 82  Temp(Src) 98 F (36.7 C) (Oral)  Wt 194 lb (87.998 kg)   CC: back pain with nausea and vomiting. Subjective:    Patient ID: Christopher Hatfield, male    DOB: 1980/07/24, 34 y.o.   MRN: [id number]  HPI: Christopher Hatfield is a 34 y.o. male presenting on 08/22/2014 for Back Pain   Presents with wife today  Woke up sick at 1:30am with nausea/diarrhea then vomiting after eating potato salad last night - ate more than any other family member. Nausea and diarrhea/vomiting has largely resolved. No one else sick at home. Chilled all day but no fever. No significant abd cramping. This episode has flared up chronic bilateral lower back pain with radiation down both legs through feet and toes, mild numbness. No weakness or bowel/bladder accidents. No saddle anesthesia. Denies inciting trauma/injury or falls.  Chronic h/o lower back pain with left radiculopathy - ongoing for last 5 months. Has seen multiple providers in our office in the past, most recently earlier this month by PCP and treated with 10d prednisone course without significant improvement. He has also seen chiropractor, has TENS unit at home, and regularly does lower back stretching exercises. He also wears a back brace.   lyrica was not effective and was expensive.  Tramadol helps pain, takes regularly TID.  Has also received ESI without much improvement. Last visit lyrica was transitioned to gabapentin -  bid and  at night time and he was referred to PT. Awaiting checking with insurance about PT.   MRI was obtained 05/31/2014 showing:  1. No high-grade spinal stenosis or focal disc herniation. 2. 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 At L3-4, extraforaminal right L3 nerve root encroachment is possible by asymmetric disc bulging laterally. No other evidence of nerve root encroachment. 3. No acute osseous findings or malalignment  Relevant past medical, surgical,  family and social history reviewed and updated as indicated. Interim medical history since our last visit reviewed. Allergies and medications reviewed and updated. Current Outpatient Prescriptions on File Prior to Visit  Medication Sig  . fluticasone (FLONASE) 50 MCG/ACT nasal spray Place 2 sprays into both nostrils daily.  Marland Kitchen gabapentin (NEURONTIN) 100 MG capsule Take 1 capsule (100 mg total) by mouth 3 (three) times daily.  . traMADol (ULTRAM) 50 MG tablet TAKE 1 TABLET (50 MG TOTAL) BY MOUTH EVERY 8 (EIGHT) HOURS AS NEEDED FOR PAIN   No current facility-administered medications on file prior to visit.    Review of Systems Per HPI unless specifically indicated above     Objective:    BP 118/76 mmHg  Pulse 82  Temp(Src) 98 F (36.7 C) (Oral)  Wt 194 lb (87.998 kg)  Wt Readings from Last 3 Encounters:  08/22/14 194 lb (87.998 kg)  07/29/14 198 lb 4 oz (89.926 kg)  05/31/14 190 lb (86.183 kg)    Physical Exam  Constitutional: He is oriented to person, place, and time. He appears well-developed and well-nourished. No distress.  Abdominal: Soft. Bowel sounds are normal. He exhibits no distension and no mass. There is no tenderness. There is no rebound and no guarding.  Musculoskeletal: He exhibits no edema.  No significant pain midline spine No paraspinous mm tenderness + SLR on left No pain with int/ext rotation at hip. + pain at left SIJ, no pain at GTB or sciatic notch bilaterally.   Neurological: He is alert and oriented to person, place, and time. He  has normal strength. No sensory deficit. He displays a negative Romberg sign. Coordination normal.  Reflex Scores:      Patellar reflexes are 1+ on the right side and 1+ on the left side. 5/5 BLE strength Able to heel and toe walk  Nursing note and vitals reviewed.  Results for orders placed or performed during the hospital encounter of 09/07/13  Hemoglobin-hemacue, POC  Result Value Ref Range   Hemoglobin 13.9 13.0 - 17.0  g/dL      Assessment & Plan:   Problem List Items Addressed This Visit    Nausea vomiting and diarrhea - Primary    Recent vomiting/diarrhea anticipate from food poisoning after eating potato salad last night. Now largely resolved. Pt declines anti emetic.      Chronic radicular low back pain    Last night vomiting/diarrheal episode likely has led to flare of chronic lower back pain with left radiculopathy - in known herniated disc but interestingly MRI showed possible compression of R L3 nerve root not left. Already has undergone extensive workup over last few months and different treatments without much improvement.  Will treat with dexamethasone IM 10mg  shot today and then start flexeril at home. Will also start voltaren tablet 75mg  bid with food for 1 wk then prn - advised to hold on starting for 2-3 days (while we ensure GI sxs have largely resolved). Pt and wife agree with plan.      Relevant Medications   cyclobenzaprine (FLEXERIL) tablet       Follow up plan: Return if symptoms worsen or fail to improve.

## 2014-08-22 NOTE — Telephone Encounter (Signed)
PLEASE NOTE: All timestamps contained within this report are represented as [place] Standard Time. CONFIDENTIALTY NOTICE: This fax transmission is intended only for the addressee. It contains information that is legally privileged, confidential or otherwise protected from use or disclosure. If you are not the intended recipient, you are strictly prohibited from reviewing, disclosing, copying using or disseminating any of this information or taking any action in reliance on or regarding this information. If you have received this fax in error, please notify us immediately by telephone so that we can arrange for its return to us. Phone: [phone], Toll-Free: [phone], Fax: [phone] Page: 1 of 2 Call Id: [id number] [facility] Client TELEPHONE ADVICE RECORD [facility] Patient Name: Christopher Hatfield Gender: Male DOB: 04/07/1981 Age: 34 Y 0 M Return Phone Number: [phone] (Primary), [phone] (Secondary) Address: [place]: [place] [id number] Client [facility] Day Physician Ermalene SearingBedsole, Virginiamy Contact Type Call Call Type Triage / Clinical Caller Name Victorino DikeJennifer Relationship To Patient Spouse Appointment Disposition EMR Appointment Not Necessary Return Phone Number [phone] (Primary) Chief Complaint Vomiting Initial Comment Caller states concerned he has food poisoning. has been up since 2 am w/ vomiting and diarrhea. body pain PreDisposition Did not know what to do Info pasted into Epic Yes Nurse Assessment Nurse: Charna Elizabethrumbull, RN, Lynden Angathy Date/Time (Eastern Time): 08/22/2014 9:15:51 AM Confirm and document reason for call. If symptomatic, describe symptoms. ---Caller states her husband developed vomiting and diarrhea this morning. He developed a low grade fever (by touch) this morning. Has the patient traveled out of the country within the last  30 days? ---No Does the patient require triage? ---Yes Related visit to physician within the last 2 weeks? ---No Does the PT have any chronic conditions? (i.e. diabetes, asthma, etc.) ---Yes List chronic conditions. ---Low back problems Guidelines Guideline Title Affirmed Question Affirmed Notes Nurse Date/Time [name](Eastern Time) Vomiting MILD or MODERATE vomiting (e.g., 1 - 5 times / day) (all triage questions negative) Trumbull, RN, [facility] 08/22/2014 9:17:28 AM Disp. Time [name](Eastern Time) Disposition Final User 08/22/2014 9:14:42 AM Attempt made - message left Charna Elizabethrumbull, RN, Lynden Angathy 08/22/2014 9:23:50 AM Home Care Yes Trumbull, RN, Cathy PLEASE NOTE: All timestamps contained within this report are represented as [place] Standard Time. CONFIDENTIALTY NOTICE: This fax transmission is intended only for the addressee. It contains information that is legally privileged, confidential or otherwise protected from use or disclosure. If you are not the intended recipient, you are strictly prohibited from reviewing, disclosing, copying using or disseminating any of this information or taking any action in reliance on or regarding this information. If you have received this fax in error, please notify us immediately by telephone so that we can arrange for its return to us. Phone: [phone], Toll-Free: [phone], Fax: [phone] Page: 2 of 2 Call Id: [id number] Caller Understands: Yes Disagree/Comply: Comply Care Advice Given Per Guideline HOME CARE: You should be able to treat this at home. REASSURANCE: Vomiting can be caused by many things. It is often caused by a stomach virus (stomach flu) or mild food poisoning. Staying well-hydrated is the most important thing. CLEAR LIQUIDS: Try to sip small amounts (1 tablespoon or 15 ml) of liquid frequently (every 5 minutes) for 8 hours, rather than trying to drink a lot of liquid all at one time. * Sip water or a rehydration drink (e.g., Gatorade or  Powerade). * Other options: 1/2 strength flat lemon-lime soda or  ginger ale. * After 4 hours without vomiting, increase the amount. SOLID FOOD: * You may begin eating bland foods after 8 hours without vomiting. * Start with saltine crackers, Pettey bread, rice, mashed potatoes, cereal, applesauce, etc. * You can resume a normal diet in 24-48 hours. AVOID NON-ESSENTIAL MEDS: * Discontinue all vitamins and non-prescription medicines for 24 hours. (Reason: may make vomiting worse.) * Avoid NSAIDs, which can cause gastritis EXPECTED COURSE: Vomiting from viral gastritis (stomach flu) usually stops in 12 to 48 hours. If diarrhea is present, it usually continues for several days. CALL BACK IF: * Vomiting lasts over 48 hours * Signs of dehydration (e.g., no urination over 12 hours, very lightheaded) * You become worse. CARE ADVICE per Vomiting (Adult) guideline. After Care Instructions Given Call Event Type User Date / Time Description

## 2014-08-22 NOTE — Telephone Encounter (Signed)
Patient Name: Marcial PacasMOTHY Fern DOB: 12/02/1980 Initial Comment Caller states concerned he has food poisoning. has been up since 2 am w/ vomiting and diarrhea. body pain Nurse Assessment Nurse: Charna Elizabethrumbull, RN, Lynden Angathy Date/Time (Eastern Time): 08/22/2014 9:15:51 AM Confirm and document reason for call. If symptomatic, describe symptoms. ---Caller states her husband developed vomiting and diarrhea this morning. He developed a low grade fever (by touch) this morning. Has the patient traveled out of the country within the last 30 days? ---No Does the patient require triage? ---Yes Related visit to physician within the last 2 weeks? ---No Does the PT have any chronic conditions? (i.e. diabetes, asthma, etc.) ---Yes List chronic conditions. ---Low back problems Guidelines Guideline Title Affirmed Question Affirmed Notes Vomiting MILD or MODERATE vomiting (e.g., 1 - 5 times / day) (all triage questions negative) Final Disposition User [facility] [place], RN, Lelia Lakeathy

## 2014-08-22 NOTE — Addendum Note (Signed)
Addended by: Annamarie MajorFUQUAY, Macari Zalesky S on: 08/22/2014 05:58 PM   Modules accepted: Orders

## 2014-08-22 NOTE — Assessment & Plan Note (Signed)
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 Already has undergone extensive workup over last few months and different treatments without much improvement.  Will treat with dexamethasone IM 10mg  shot today and then start flexeril at home. Will also start voltaren tablet 75mg  bid with food for 1 wk then prn - advised to hold on starting for 2-3 days (while we ensure GI sxs have largely resolved). Pt and wife agree with plan.

## 2014-08-22 NOTE — Progress Notes (Signed)
Pre visit review using our clinic review tool, if applicable. No additional management support is needed unless otherwise documented below in the visit note. 

## 2014-08-28 ENCOUNTER — Other Ambulatory Visit: Payer: Self-pay | Admitting: Family Medicine

## 2014-08-28 NOTE — Telephone Encounter (Signed)
Last office visit 08/22/2014 with Dr. Reece AgarG.  Last refilled 07/29/2014 for #90 with no refills.  Ok to refill?

## 2014-08-29 NOTE — Telephone Encounter (Signed)
Called to [facility]  

## 2014-09-25 ENCOUNTER — Telehealth: Payer: Self-pay

## 2014-09-25 DIAGNOSIS — M5416 Radiculopathy, lumbar region: Principal | ICD-10-CM

## 2014-09-25 DIAGNOSIS — G8929 Other chronic pain: Secondary | ICD-10-CM

## 2014-09-25 MED ORDER — GABAPENTIN 100 MG PO CAPS: ORAL_CAPSULE | ORAL | Status: DC

## 2014-09-25 NOTE — Telephone Encounter (Signed)
Pt left v/m; pt seen 08/22/14;pt received prednisone injection and pain was improved for 2 days. Pt wants to know if could get another prednisone injection, or could oral prednisone be sent to [facility] [place] or what should pt do. Pt request cb.

## 2014-09-25 NOTE — Telephone Encounter (Addendum)
Spoke with patient. He said he hasn't gone to PT yet because it hadn't been scheduled. He has been using the voltaren and flexeril, but they haven't been any help since the steroid wore off. He will try the gabapentin. Refilled for him. [facility] Advised that Dr. B would advise further on steroid injection. He verbalized understanding. **No referral for PT in chart-will need referral**

## 2014-09-25 NOTE — Telephone Encounter (Signed)
Would see if pt has been to PT yet and ensure he has tried voltaren and flexeril prescribed last visit. We also have option of continued increased titration of gabapentin if pt desires - would go up to 300mg  twice daily (am and pm) with 100mg  mid day. Will also route to PCP for this chronic problem. ?rpt IM steroid injection

## 2014-09-26 NOTE — Telephone Encounter (Signed)
Jearld notified as instructed by telephone.  He will await call from [facility] or [place] with his PT appointment.

## 2014-09-26 NOTE — Telephone Encounter (Signed)
Start with plan as laid out by Dr. Reece AgarG, Sent in referral  to PT. Call  if not improving in 2-3 weeks.

## 2014-10-01 ENCOUNTER — Other Ambulatory Visit: Payer: Self-pay | Admitting: Family Medicine

## 2014-10-01 NOTE — Telephone Encounter (Signed)
Last office visit 08/22/2014.  Last refilled 08/29/2014 for #90 with no refills.  Ok to refill?

## 2014-10-01 NOTE — Telephone Encounter (Signed)
Called to [facility] [place]  

## 2014-10-11 NOTE — Telephone Encounter (Signed)
Let pt know that at this point I would recommend referral to back specialist. Let me know if he is agreeable.

## 2014-10-11 NOTE — Telephone Encounter (Signed)
Pt left v/m; pt has been taking gabapentin as instructed; Gabapentin has not made any difference in how pt feels and wants to know what to do next. Pt request cb. [facility].

## 2014-10-14 NOTE — Telephone Encounter (Signed)
Christopher Hatfield notified as instructed by telephone.  He states he started going to PT last week and would like to continue with that a little longer before being referred to a back specialist.  He will call us back if he decides to move forward with that referral.

## 2014-11-04 ENCOUNTER — Other Ambulatory Visit: Payer: Self-pay | Admitting: Family Medicine

## 2014-11-04 NOTE — Telephone Encounter (Signed)
Last office visit 08/22/2014.   Last refilled 10/01/2014 for #90 with no refills.  Ok to refill?

## 2014-11-05 NOTE — Telephone Encounter (Addendum)
[facility]

## 2014-11-29 ENCOUNTER — Telehealth: Payer: Self-pay | Admitting: Family Medicine

## 2014-11-29 DIAGNOSIS — M5442 Lumbago with sciatica, left side: Principal | ICD-10-CM

## 2014-11-29 DIAGNOSIS — M5441 Lumbago with sciatica, right side: Secondary | ICD-10-CM

## 2014-11-29 NOTE — Telephone Encounter (Signed)
Referral sent 

## 2014-11-29 NOTE — Telephone Encounter (Signed)
Patient called and said he'd like to be referred to a specialist for his low back pain.  Patient said he has seen a Chiropractor, Dr.Salma, and he did back and neck x-rays.  Patient [place].  Patient can go anytime.

## 2014-11-29 NOTE — Addendum Note (Signed)
Addended by: Kerby NoraBEDSOLE, Sondos Wolfman E on: 11/29/2014 05:05 PM   Modules accepted: Orders

## 2014-11-29 NOTE — Telephone Encounter (Signed)
Opened in error

## 2014-12-02 NOTE — Telephone Encounter (Signed)
Referral faxed to Dr Jenkins  

## 2014-12-04 ENCOUNTER — Other Ambulatory Visit: Payer: Self-pay | Admitting: Family Medicine

## 2014-12-04 NOTE — Telephone Encounter (Signed)
Last office visit 08/22/2014 with Dr. Reece AgarG.  Last refilled 11/04/2014 for #90 with no refills.  Ok to refill?

## 2014-12-05 NOTE — Telephone Encounter (Signed)
Called to [facility].

## 2014-12-15 ENCOUNTER — Other Ambulatory Visit: Payer: Self-pay | Admitting: Family Medicine

## 2015-01-01 ENCOUNTER — Other Ambulatory Visit: Payer: Self-pay | Admitting: Family Medicine

## 2015-01-01 NOTE — Telephone Encounter (Signed)
Last office visit 08/22/2014 with Dr. Reece AgarG.  Last refilled 12/05/2014 for #90 with no refills.  Ok to refill?

## 2015-01-02 NOTE — Telephone Encounter (Signed)
[facility]  

## 2015-02-03 ENCOUNTER — Other Ambulatory Visit: Payer: Self-pay | Admitting: Family Medicine

## 2015-02-03 NOTE — Telephone Encounter (Signed)
Ok to refill? Last filled 01/02/15

## 2015-02-04 NOTE — Telephone Encounter (Signed)
Rx called in as directed.   

## 2015-03-26 ENCOUNTER — Other Ambulatory Visit: Payer: Self-pay | Admitting: Family Medicine

## 2015-03-26 NOTE — Telephone Encounter (Signed)
Last office visit 08/22/2014 with Dr. Sharen Hones.  Last refilled 02/04/2015 for #90 with no refills.  Ok to refill?

## 2015-03-27 NOTE — Telephone Encounter (Signed)
Tramadol called into [facility]. 

## 2015-05-26 ENCOUNTER — Other Ambulatory Visit: Payer: Self-pay | Admitting: Family Medicine

## 2015-05-26 NOTE — Telephone Encounter (Signed)
[facility] into [facility] [place].

## 2015-05-26 NOTE — Telephone Encounter (Signed)
Last office visit 08/22/2014 with Dr. Sharen HonesGutierrez.  Last refilled 03/26/2015 for #90 with no refills.  Ok to refill?

## 2015-06-25 ENCOUNTER — Other Ambulatory Visit: Payer: Self-pay | Admitting: Family Medicine

## 2015-06-25 NOTE — Telephone Encounter (Signed)
Last office visit 08/22/2014 with Dr. Reece AgarG.  Last refilled 05/26/2015 for #90 with no refills.  Ok to refill?

## 2015-06-26 NOTE — Telephone Encounter (Signed)
Tramadol called into [facility] [place]. 

## 2015-07-25 ENCOUNTER — Other Ambulatory Visit: Payer: Self-pay | Admitting: Family Medicine

## 2015-07-25 NOTE — Telephone Encounter (Signed)
Last office visit 08/22/2014 with Dr. Reece Agar.  Last refilled 06/25/2015 for #90 with no refills.  Ok to refill?

## 2015-07-25 NOTE — Telephone Encounter (Signed)
Tramadol called into [facility] [place]. 

## 2015-08-25 ENCOUNTER — Other Ambulatory Visit: Payer: Self-pay | Admitting: Family Medicine

## 2015-08-25 NOTE — Telephone Encounter (Signed)
Last office visit 08/22/2014 with Dr. Reece Agar.  Last refilled 07/25/2015 for #90 with no refills.  No future appointments scheduled.  Refill?

## 2015-08-25 NOTE — Telephone Encounter (Signed)
Needs appt before refill. Refill until then.

## 2015-08-25 NOTE — Telephone Encounter (Signed)
Appointment scheduled for follow up back pain 09/05/2015 at 2:30 pm with Dr. Ermalene Searing.   Tramadol called in to [facility] [place].

## 2015-09-05 ENCOUNTER — Ambulatory Visit (INDEPENDENT_AMBULATORY_CARE_PROVIDER_SITE_OTHER): Payer: BLUE CROSS/BLUE SHIELD | Admitting: Family Medicine

## 2015-09-05 ENCOUNTER — Encounter: Payer: Self-pay | Admitting: Family Medicine

## 2015-09-05 VITALS — BP 110/70 | HR 70 | Temp 97.4°F | Ht 67.75 in | Wt 187.0 lb

## 2015-09-05 DIAGNOSIS — J309 Allergic rhinitis, unspecified: Secondary | ICD-10-CM | POA: Diagnosis not present

## 2015-09-05 DIAGNOSIS — M5410 Radiculopathy, site unspecified: Secondary | ICD-10-CM

## 2015-09-05 DIAGNOSIS — R519 Headache, unspecified: Secondary | ICD-10-CM

## 2015-09-05 DIAGNOSIS — R51 Headache: Secondary | ICD-10-CM

## 2015-09-05 DIAGNOSIS — G8929 Other chronic pain: Secondary | ICD-10-CM | POA: Diagnosis not present

## 2015-09-05 DIAGNOSIS — M5416 Radiculopathy, lumbar region: Secondary | ICD-10-CM

## 2015-09-05 MED ORDER — CYCLOBENZAPRINE HCL 10 MG PO TABS: 10.0000 mg | ORAL_TABLET | Freq: Every evening | ORAL | Status: DC | PRN

## 2015-09-05 MED ORDER — TRAMADOL HCL 50 MG PO TABS: 50.0000 mg | ORAL_TABLET | Freq: Three times a day (TID) | ORAL | Status: DC | PRN

## 2015-09-05 NOTE — Progress Notes (Signed)
   Subjective:    Patient ID: Christopher Hatfield, male    DOB: 01/01/1981, 35 y.o.   MRN: [id number]  HPI [age] year old male with chronic radicular low back pain presents for follow up.  He uses tramadol for pain as well as flexeril.  In past he has used prednisone taper.. No relief. Chiropractor, Tens unit, Home PT.[facility] Kitchen. Helped a little in past.  Was on trial of lyrica: did not help and was costly. MRI 2016 was obtained showing: 1. No high-grade spinal stenosis or focal disc herniation. 2. 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 At L3-4, extraforaminal right L3 nerve root encroachment is possible by asymmetric disc bulging laterally. No other evidence of nerve root encroachment. 3. No acute osseous findings or malalignment  Received ESI in 2016.. Did not help at all. Trial of Neurontin 07/2014: did not help at moderate dose 300, 100, 300 mg, was worried about SE. Voltaren not help. Dexamathmethsone injection IM made pain resolve for 2 days. PT: mildly helpful   He continues doing home stretches. Occ uses TENs unit  Lying on foam roll helps with pressure in mid back. Using tramadol three times a day.. Decreases pain significantly most days.. Some days it does not. Occ using using flexeril to help sleep.  Having headaches and blood in mucus in AM x 3 weeks. Pain in right temple and forehead and behind eyes. Sens to light and sound. Occ congestion.  No sneeze. No SOB, no cough, heaviness in chest. No fever. Using flonase daily.     Review of Systems  Constitutional: Negative for fever and fatigue.  HENT: Negative for ear pain.   Respiratory: Negative for cough.   Cardiovascular: Negative for leg swelling.       Objective:   Physical Exam  Constitutional: Vital signs are normal. He appears well-developed and well-nourished.  HENT:  Head: Normocephalic.  Right Ear: Hearing normal.  Left Ear: Hearing normal.  Nose: Mucosal edema and rhinorrhea  present. Right sinus exhibits maxillary sinus tenderness. Left sinus exhibits maxillary sinus tenderness.  Mouth/Throat: Oropharynx is clear and moist and mucous membranes are normal.  Neck: Trachea normal. Carotid bruit is not present. No thyroid mass and no thyromegaly present.  Cardiovascular: Normal rate, regular rhythm and normal pulses.  Exam reveals no gallop, no distant heart sounds and no friction rub.   No murmur heard. No peripheral edema  Pulmonary/Chest: Effort normal and breath sounds normal. No respiratory distress.  Musculoskeletal:       Thoracic back: He exhibits tenderness. He exhibits normal range of motion, no bony tenderness, no swelling and no deformity.       Lumbar back: He exhibits decreased range of motion. He exhibits no tenderness, no bony tenderness, no swelling, no edema, no pain and no spasm.  Neg SLR, neg Faber's  Skin: Skin is warm, dry and intact. No rash noted.  Psychiatric: He has a normal mood and affect. His speech is normal and behavior is normal. Thought content normal.          Assessment & Plan:

## 2015-09-05 NOTE — Progress Notes (Signed)
Pre visit review using our clinic review tool, if applicable. No additional management support is needed unless otherwise documented below in the visit note. 

## 2015-09-05 NOTE — Patient Instructions (Addendum)
Call Dr. Ethelene Halamos to discuss further option.  Continue tramadol three times daily.  Use flexeril at bedtime for muscle spasm.  Continue flonase.  At bedtime start zyrtec plain.  Start nasal saline spray  2-3 time a day .  Call if not improving as expected.

## 2015-09-30 NOTE — Assessment & Plan Note (Signed)
Call Dr. Ethelene Halamos to discuss further options.  Continue tramadol three times daily. Refilled.  Use flexeril at bedtime for muscle spasm.  Continue home PT.

## 2015-09-30 NOTE — Assessment & Plan Note (Signed)
Likely due to allergies. 

## 2015-09-30 NOTE — Assessment & Plan Note (Signed)
No clear sign of  Bacterial sinus infection.  Continue flonase.  At bedtime start zyrtec plain.  Start nasal saline spray  2-3 time a day .

## 2015-10-06 ENCOUNTER — Other Ambulatory Visit: Payer: Self-pay | Admitting: Family Medicine

## 2015-10-06 NOTE — Telephone Encounter (Signed)
Last office visit 09/05/2015.  Last refilled 09/05/2015 for #90 with no refills.  Ok to refill?

## 2015-10-07 NOTE — Telephone Encounter (Signed)
Tramadol called into [facility]. 

## 2015-11-04 ENCOUNTER — Other Ambulatory Visit: Payer: Self-pay | Admitting: Family Medicine

## 2015-11-05 NOTE — Telephone Encounter (Signed)
Last office visit 09/05/2015.  Last refilled 10/07/2015 for #90 with no refills.  Ok to refill?

## 2015-11-06 NOTE — Telephone Encounter (Signed)
Tramadol called into [facility] [place]. 

## 2015-12-07 ENCOUNTER — Other Ambulatory Visit: Payer: Self-pay | Admitting: Family Medicine

## 2015-12-07 NOTE — Telephone Encounter (Signed)
Last office visit 09/05/15.  Last refilled 10/27/15 for #90 with no refills  Ok to refill?

## 2015-12-08 NOTE — Telephone Encounter (Signed)
Tramadol called into [facility]. 

## 2016-01-02 ENCOUNTER — Encounter: Payer: Self-pay | Admitting: Family Medicine

## 2016-01-02 ENCOUNTER — Ambulatory Visit (INDEPENDENT_AMBULATORY_CARE_PROVIDER_SITE_OTHER): Payer: BLUE CROSS/BLUE SHIELD | Admitting: Family Medicine

## 2016-01-02 VITALS — BP 106/72 | HR 55 | Temp 97.8°F | Ht 67.75 in | Wt 190.5 lb

## 2016-01-02 DIAGNOSIS — F331 Major depressive disorder, recurrent, moderate: Secondary | ICD-10-CM | POA: Diagnosis not present

## 2016-01-02 MED ORDER — SERTRALINE HCL 50 MG PO TABS: 50.0000 mg | ORAL_TABLET | Freq: Every day | ORAL | Status: DC

## 2016-01-02 NOTE — Progress Notes (Signed)
   Subjective:    Patient ID: Christopher Hatfield, male    DOB: [birth year], 35 y.o.   MRN: [id number]  HPI  35 year old male with depression major recurrent  presents for anxiety. He has been feeling very overwhelmed lately, mood fluctuations in last several months Angry a lot. Has had a lot of marital issues, job stress and issues with kids.  He has off and on issues with depression for years. Last on sertraline in 2015. He is currently on sertraline 50 mg daily. He restarted this 2 weeks ago. No SE except headache, but this was ongoing before restarted.  No improvement so far.  If stays busy at work he is okay. But if talks to wife or on way home.. Gets sad and angry, anxious. No discrete panic attacks.  Trouble sleeping at night, if has good day, able to sleep. Occ benadryl helps with sleeps.  Strong family history of depression.   bipolar questionaaire neg  PHQ 20   GAD7 21   Today    He and wife are seeing his pastor for counsling.  Social History /Family History/Past Medical History reviewed and updated if needed.  Review of Systems  Constitutional: Positive for fatigue. Negative for fever.  HENT: Negative for ear pain.   Eyes: Negative for pain.  Respiratory: Negative for shortness of breath.   Cardiovascular: Negative for chest pain.       Objective:   Physical Exam  Constitutional: Vital signs are normal. He appears well-developed and well-nourished.  HENT:  Head: Normocephalic.  Right Ear: Hearing normal.  Left Ear: Hearing normal.  Nose: Nose normal.  Mouth/Throat: Oropharynx is clear and moist and mucous membranes are normal.  Neck: Trachea normal. Carotid bruit is not present. No thyroid mass and no thyromegaly present.  Cardiovascular: Normal rate, regular rhythm and normal pulses.  Exam reveals no gallop, no distant heart sounds and no friction rub.   No murmur heard. No peripheral edema  Pulmonary/Chest: Effort normal and breath sounds normal. No  respiratory distress.  Skin: Skin is warm, dry and intact. No rash noted.  Psychiatric: Judgment and thought content normal. His mood appears anxious. His speech is not rapid and/or pressured and not slurred. He is agitated and withdrawn. Cognition and memory are normal. He exhibits a depressed mood. He expresses no homicidal and no suicidal ideation. He expresses no suicidal plans and no homicidal plans.  Does not make eye contact          Assessment & Plan:

## 2016-01-02 NOTE — Progress Notes (Signed)
Pre visit review using our clinic review tool, if applicable. No additional management support is needed unless otherwise documented below in the visit note. 

## 2016-01-02 NOTE — Patient Instructions (Addendum)
Consider counseling on own as well as continuing marriage counsling. Continue sertraline. Follow up in 2 weeks for re-eval of mood.

## 2016-01-02 NOTE — Assessment & Plan Note (Signed)
Continue sertraline. Offered counseling. Continue marriage counseling.  Stress management.  Follow up in 2 weeks to determine if medication working.

## 2016-01-05 ENCOUNTER — Other Ambulatory Visit: Payer: Self-pay | Admitting: *Deleted

## 2016-01-05 NOTE — Telephone Encounter (Signed)
Last office visit 01/02/2016.  Ok to refill?

## 2016-01-06 MED ORDER — TRAMADOL HCL 50 MG PO TABS: 50.0000 mg | ORAL_TABLET | Freq: Three times a day (TID) | ORAL | Status: DC | PRN

## 2016-01-06 MED ORDER — CYCLOBENZAPRINE HCL 10 MG PO TABS: 10.0000 mg | ORAL_TABLET | Freq: Every evening | ORAL | Status: DC | PRN

## 2016-01-06 NOTE — Telephone Encounter (Signed)
Tramadol called into [facility]. 

## 2016-01-08 ENCOUNTER — Telehealth: Payer: Self-pay

## 2016-01-08 DIAGNOSIS — F411 Generalized anxiety disorder: Secondary | ICD-10-CM | POA: Diagnosis not present

## 2016-01-08 NOTE — Telephone Encounter (Signed)
Pt taking zoloft 50 mg one daily and pt cannot tell improvement in depression or anxiety;having hard time focusing at work; pt is going thru marital issues; both pt and wife to see counselor this evening. No SI/HI. [facility] [place]. Pt request cb. Pt wanted to know if any increase in zoloft or different med added until f/u appt on 01/23/16.

## 2016-01-08 NOTE — Telephone Encounter (Signed)
Have pt increase zoloft to 100 mg daily. Send in new rx for 30 with 3 rf. Pt to follow up in 1 month if not already scheduled.

## 2016-01-09 NOTE — Telephone Encounter (Signed)
Spoke to pt and advised per Dr Ermalene SearingBedsole. Pt states he recently picked up Rx for #30 of 50mg . He will "double-up" on those and receive a new Rx for 100mg  at his 7/28 appt

## 2016-01-19 MED ORDER — SERTRALINE HCL 100 MG PO TABS: 100.0000 mg | ORAL_TABLET | Freq: Every day | ORAL | 3 refills | Status: DC

## 2016-01-19 NOTE — Addendum Note (Signed)
Addended by: Damita Lack on: 01/19/2016 09:14 AM   Modules accepted: Orders

## 2016-01-19 NOTE — Telephone Encounter (Signed)
Pt called this morning and only has one Zoloft left.  He was told to increase dosage to 2 pills per day and needs to get updated prescription before 01/23/16.  Send updated prescription to [facility] [place].  Best number to call pt is [phone].

## 2016-01-19 NOTE — Telephone Encounter (Signed)
[facility] [place]. Christopher Hatfield notified.

## 2016-01-20 DIAGNOSIS — F411 Generalized anxiety disorder: Secondary | ICD-10-CM | POA: Diagnosis not present

## 2016-01-23 ENCOUNTER — Encounter: Payer: Self-pay | Admitting: Family Medicine

## 2016-01-23 ENCOUNTER — Ambulatory Visit (INDEPENDENT_AMBULATORY_CARE_PROVIDER_SITE_OTHER): Payer: BLUE CROSS/BLUE SHIELD | Admitting: Family Medicine

## 2016-01-23 DIAGNOSIS — F331 Major depressive disorder, recurrent, moderate: Secondary | ICD-10-CM

## 2016-01-23 MED ORDER — SERTRALINE HCL 100 MG PO TABS: 150.0000 mg | ORAL_TABLET | Freq: Every day | ORAL | 3 refills | Status: DC

## 2016-01-23 NOTE — Patient Instructions (Addendum)
Keep appt as scheduled for physical and mood. Decrease tramadol use as able. Increase sertraline to 150 mg daily.

## 2016-01-23 NOTE — Progress Notes (Signed)
Pre visit review using our clinic review tool, if applicable. No additional management support is needed unless otherwise documented below in the visit note. 

## 2016-01-23 NOTE — Progress Notes (Signed)
   Subjective:    Patient ID: Christopher Hatfield, male    DOB: 1981/02/05, 35 y.o.   MRN: [id number]  HPI   35 year old male presents for 2 week follow up of major recurrent depression.  At last OV he was continued on 100 mg of sertraline daily, which he had increase on his own. Rec counseling and marriage counseling. He has seen marriage counselor in last few weeks.  Today he reports continued issues with anhedonia, depression, trouble sleeping, poor appetite, feeling guilty, trouble concentrating and occ suicidal ideation that he states he will never act on.  No SE to higher dose of sertraline.  PHQ9 went from 20 to 21!  Review of Systems  Constitutional: Positive for fatigue.  HENT: Negative for ear pain.   Eyes: Negative for pain.  Respiratory: Negative for shortness of breath.   Cardiovascular: Negative for chest pain.       Objective:   Physical Exam  Constitutional: Vital signs are normal. He appears well-developed and well-nourished.  HENT:  Head: Normocephalic.  Right Ear: Hearing normal.  Left Ear: Hearing normal.  Nose: Nose normal.  Mouth/Throat: Oropharynx is clear and moist and mucous membranes are normal.  Neck: Trachea normal. Carotid bruit is not present. No thyroid mass and no thyromegaly present.  Cardiovascular: Normal rate, regular rhythm and normal pulses.  Exam reveals no gallop, no distant heart sounds and no friction rub.   No murmur heard. No peripheral edema  Pulmonary/Chest: Effort normal and breath sounds normal. No respiratory distress.  Skin: Skin is warm, dry and intact. No rash noted.  Psychiatric: He has a normal mood and affect. His speech is normal and behavior is normal. Judgment and thought content normal. Cognition and memory are normal. He expresses no suicidal ideation. He expresses no suicidal plans.          Assessment & Plan:

## 2016-01-23 NOTE — Assessment & Plan Note (Signed)
Inadequate control , increase sertraline to 150 mg daily. Continue counseling. Decrease tramadol use.  Follow up in 1 month.

## 2016-02-04 ENCOUNTER — Other Ambulatory Visit: Payer: Self-pay | Admitting: Family Medicine

## 2016-02-05 ENCOUNTER — Other Ambulatory Visit: Payer: Self-pay | Admitting: Family Medicine

## 2016-02-05 MED ORDER — CYCLOBENZAPRINE HCL 10 MG PO TABS: 10.0000 mg | ORAL_TABLET | Freq: Every evening | ORAL | 0 refills | Status: DC | PRN

## 2016-02-05 NOTE — Telephone Encounter (Signed)
Rx called in to requested pharmacy 

## 2016-02-05 NOTE — Telephone Encounter (Signed)
Last f/u 12/2015 

## 2016-02-24 ENCOUNTER — Telehealth: Payer: Self-pay | Admitting: Family Medicine

## 2016-02-24 ENCOUNTER — Other Ambulatory Visit (INDEPENDENT_AMBULATORY_CARE_PROVIDER_SITE_OTHER): Payer: BLUE CROSS/BLUE SHIELD

## 2016-02-24 DIAGNOSIS — Z13220 Encounter for screening for lipoid disorders: Secondary | ICD-10-CM

## 2016-02-24 LAB — LIPID PANEL
CHOL/HDL RATIO: 2
Cholesterol: 144 mg/dL (ref 0–200)
HDL: 58.9 mg/dL (ref >39.00)
LDL CALC: 71 mg/dL (ref 0–99)
NONHDL: 84.64
Triglycerides: 66 mg/dL (ref 0.0–149.0)
VLDL: 13.2 mg/dL (ref 0.0–40.0)

## 2016-02-24 LAB — COMPREHENSIVE METABOLIC PANEL
ALT: 25 U/L (ref 0–53)
AST: 19 U/L (ref 0–37)
Albumin: 4.2 g/dL (ref 3.5–5.2)
Alkaline Phosphatase: 52 U/L (ref 39–117)
BUN: 13 mg/dL (ref 6–23)
CHLORIDE: 106 meq/L (ref 96–112)
CO2: 30 meq/L (ref 19–32)
Calcium: 8.8 mg/dL (ref 8.4–10.5)
Creatinine, Ser: 0.95 mg/dL (ref 0.40–1.50)
GFR: 94.59 mL/min (ref >60.00)
GLUCOSE: 81 mg/dL (ref 70–99)
POTASSIUM: 4.5 meq/L (ref 3.5–5.1)
SODIUM: 140 meq/L (ref 135–145)
TOTAL PROTEIN: 6.6 g/dL (ref 6.0–8.3)
Total Bilirubin: 0.3 mg/dL (ref 0.2–1.2)

## 2016-02-24 NOTE — Telephone Encounter (Signed)
-----   Message from Baldomero LamyNatasha C Chavers sent at 02/18/2016  1:26 PM EDT ----- Regarding: Cpx labs Tues 8/29, need orders. Thanks! :-) Please order  future cpx labs for pt's upcoming lab appt. Thanks Rodney Boozeasha

## 2016-03-02 ENCOUNTER — Encounter: Payer: Self-pay | Admitting: Family Medicine

## 2016-03-02 ENCOUNTER — Ambulatory Visit (INDEPENDENT_AMBULATORY_CARE_PROVIDER_SITE_OTHER): Payer: BLUE CROSS/BLUE SHIELD | Admitting: Family Medicine

## 2016-03-02 VITALS — BP 132/78 | HR 66 | Temp 98.4°F | Ht 68.25 in | Wt 197.2 lb

## 2016-03-02 DIAGNOSIS — J309 Allergic rhinitis, unspecified: Secondary | ICD-10-CM | POA: Diagnosis not present

## 2016-03-02 DIAGNOSIS — M5410 Radiculopathy, site unspecified: Secondary | ICD-10-CM

## 2016-03-02 DIAGNOSIS — Z0000 Encounter for general adult medical examination without abnormal findings: Secondary | ICD-10-CM

## 2016-03-02 DIAGNOSIS — F331 Major depressive disorder, recurrent, moderate: Secondary | ICD-10-CM | POA: Diagnosis not present

## 2016-03-02 DIAGNOSIS — M5416 Radiculopathy, lumbar region: Secondary | ICD-10-CM

## 2016-03-02 DIAGNOSIS — G8929 Other chronic pain: Secondary | ICD-10-CM

## 2016-03-02 MED ORDER — SERTRALINE HCL 100 MG PO TABS: 150.0000 mg | ORAL_TABLET | Freq: Every day | ORAL | 1 refills | Status: DC

## 2016-03-02 MED ORDER — GABAPENTIN 100 MG PO CAPS: ORAL_CAPSULE | ORAL | 3 refills | Status: DC

## 2016-03-02 NOTE — Assessment & Plan Note (Addendum)
Unable to decrease tramadol use and still function.  Will retry course of gabapentin as pt had re-injury of back and did not adequately try trial last year.  ESI ineffective per pt in past. Lyrica to expensive.  Never tried amitryptiline or cymablta in past.  SSRI has not helped back pain at all.  Follow up in 1-2 months.

## 2016-03-02 NOTE — Progress Notes (Signed)
Subjective:    Patient ID: Christopher Hatfield, male    DOB: Oct 31, 1980, 35 y.o.   MRN: [id number]  HPI  The patient is here for annual wellness exam and preventative care.    Major depressive disorder:  At last OV 7/28.Marland Kitchenincreased sertraline to 150 mg daily. Referred to counseling.  PHQ9: 11, down from 21. Occ feels forgetful from med SE.  Reviewed labs in detail. NML CMET and lipid panel , LDL at goal < 130. Exercise: walks some, but not much, yard work  Diet: Moderate  Chronic radicular low back pain: Followed by Dr. Ethelene Hal. In past given injections, did not help. Using tramadol  three times a day.  Pain is mostly tolerable, able to work, able to sleep..  Pain occ radiates to left leg. No numbness or weakness. Flexeril at bedtime. Home PT. MRI 2015: IMPRESSION: 1. No high-grade spinal stenosis or focal disc herniation. 2. 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 At L3-4, extraforaminal right L3 nerve root encroachment is possible by asymmetric disc bulging laterally. No other evidence of nerve root encroachment. 3. No acute osseous findings or malalignment.  Allergic rhinitis: Stable control on flonase and allegra.     Review of Systems  Constitutional: Negative for fatigue and fever.  HENT: Negative for ear pain.   Eyes: Negative for pain.  Respiratory: Negative for cough and shortness of breath.   Cardiovascular: Negative for chest pain, palpitations and leg swelling.  Gastrointestinal: Negative for abdominal pain.  Genitourinary: Negative for dysuria.  Musculoskeletal: Positive for back pain. Negative for arthralgias.  Neurological: Negative for syncope, light-headedness and headaches.  Psychiatric/Behavioral: Negative for dysphoric mood.       Objective:   Physical Exam  Constitutional: He is oriented to person, place, and time. Vital signs are normal. He appears well-developed and well-nourished.  HENT:  Head: Normocephalic.    Right Ear: Hearing normal.  Left Ear: Hearing normal.  Nose: Nose normal.  Mouth/Throat: Oropharynx is clear and moist and mucous membranes are normal.  Neck: Trachea normal. Carotid bruit is not present. No thyroid mass and no thyromegaly present.  Cardiovascular: Normal rate, regular rhythm and normal pulses.  Exam reveals no gallop, no distant heart sounds and no friction rub.   No murmur heard. No peripheral edema  Pulmonary/Chest: Effort normal and breath sounds normal. No respiratory distress.  Musculoskeletal:       Right shoulder: He exhibits normal range of motion, no tenderness, no bony tenderness and no deformity.       Lumbar back: He exhibits decreased range of motion. He exhibits no tenderness, no bony tenderness and no swelling.  Neg SLR, neg Faber's  Neurological: He is alert and oriented to person, place, and time. He has normal reflexes. He displays normal reflexes. No cranial nerve deficit. He exhibits normal muscle tone. Coordination normal.  Skin: Skin is warm, dry and intact. No rash noted.  Psychiatric: He has a normal mood and affect. His speech is normal and behavior is normal. Thought content normal.          Assessment & Plan:  The patient's preventative maintenance and recommended screening tests for an annual wellness exam were reviewed in full today. Brought up to date unless services declined.  Counselled on the importance of diet, exercise, and its role in overall health and mortality. The patient's FH and SH was reviewed, including their home life, tobacco status, and drug and alcohol status.    Vaccines: Up to  date with T[facility] at [facility].  Refused flu vaccine. Colon: no early family history. Prostate cancer  in dad at age early 7460s No concerns in genital or rectral area. Nonsmoker.

## 2016-03-02 NOTE — Patient Instructions (Addendum)
Continue healthy diet, try to increase exercise as able.  Start low dose gabapentin 100 mg at bedtime, can increase to 3 caps daily or alternatively up to 300 mg at bedtime. Work on trying to do low back stretching and core strengthening.

## 2016-03-02 NOTE — Assessment & Plan Note (Signed)
Stable control on allegra and flonase.

## 2016-03-02 NOTE — Progress Notes (Signed)
Pre visit review using our clinic review tool, if applicable. No additional management support is needed unless otherwise documented below in the visit note. 

## 2016-03-02 NOTE — Assessment & Plan Note (Signed)
Improved control on sertraline 150 mg daily. Encouraged exercise.

## 2016-03-04 ENCOUNTER — Other Ambulatory Visit: Payer: Self-pay | Admitting: Family Medicine

## 2016-03-05 NOTE — Telephone Encounter (Signed)
Tramadol called into [facility]. 

## 2016-03-05 NOTE — Telephone Encounter (Signed)
Last office visit 03/02/2016.  Last refilled 02/05/2016 for #90 with no refills.  Ok to refill?

## 2016-03-26 ENCOUNTER — Other Ambulatory Visit: Payer: Self-pay | Admitting: Family Medicine

## 2016-03-30 ENCOUNTER — Telehealth: Payer: Self-pay

## 2016-03-30 MED ORDER — GABAPENTIN 100 MG PO CAPS: ORAL_CAPSULE | ORAL | 3 refills | Status: DC

## 2016-03-30 NOTE — Telephone Encounter (Signed)
Pt left v/m; pt was seen 03/02/16; pt taking gabapentin 100 mg taking 3 caps at hs for back pain; gabapentin is not helping; does gabapentin need to be increased or change to different med. Pt request cb. [facility].

## 2016-03-30 NOTE — Telephone Encounter (Signed)
Increase to 100 mg in AM, 100 mg midday and 300 mg at bedtime if pain during the day. If pain mostly at night increase 100 mg weekly to 600 mg qHS. Send in rx as needed with new instructions.

## 2016-03-30 NOTE — Telephone Encounter (Signed)
Mr. Christopher Hatfield notified as instructed by telephone.  He states his pain is mostly during the day.  Will increase Gabapentin to 100 mg in the am, 100 mg midday and 300 mg at bedtime.  Refill sent into his pharmacy with new instructions.  Advised to try this dose for 1 to 2 weeks and let us know how he is doing on new dose. Patient in agreement with plan.

## 2016-04-03 IMAGING — MR MR LUMBAR SPINE W/O CM
4 of 5 series · 15 of 48 positions shown · non-contrast
Comparison: Radiographs 05/24/2014.

CLINICAL DATA: Low back pain with primarily left-sided
radiculopathy and some numbness in the right foot for 3 -6 months.
No acute injury or prior relevant surgery. Initial encounter.

EXAM:
MRI LUMBAR SPINE WITHOUT CONTRAST
TECHNIQUE: Multiplanar, multisequence MR imaging of the lumbar spine was
performed. No intravenous contrast was administered.

[Series 3: T2 · sagittal · 4.0mm · 0.78mm/px · 6 of 15 slices shown (1 of 2)]
[im 1/15]
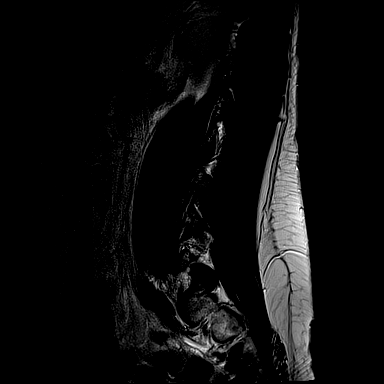
[im 3/15]
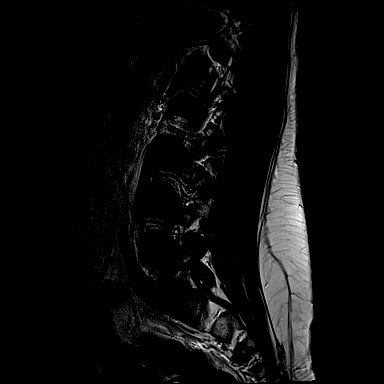
[im 6/15]
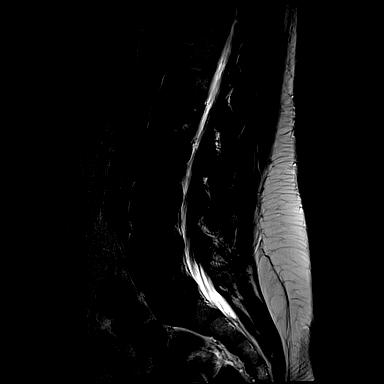
[im 9/15]
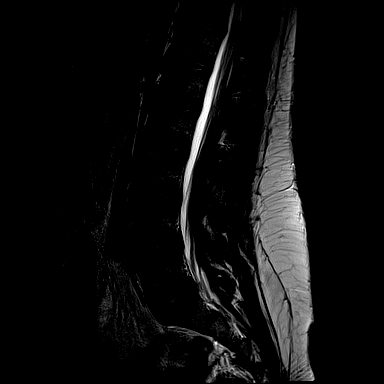
[im 12/15]
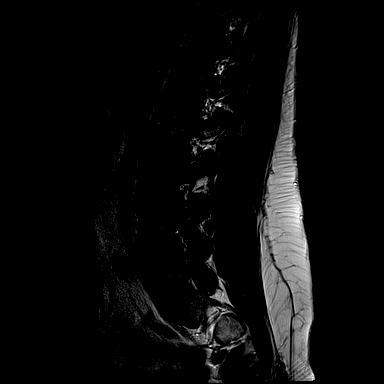
[im 15/15]
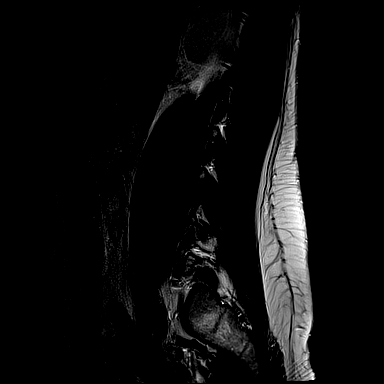

[Series 4: T1 · sagittal · 4.0mm · 0.38mm/px · 3 of 15 slices shown (1 of 2)]
[im 3/15]
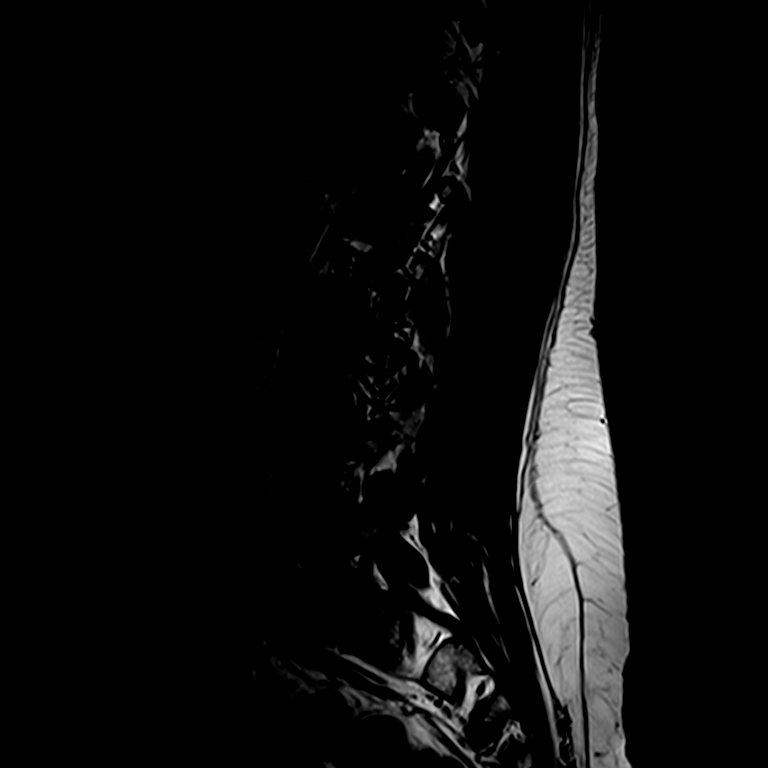
[im 9/15]
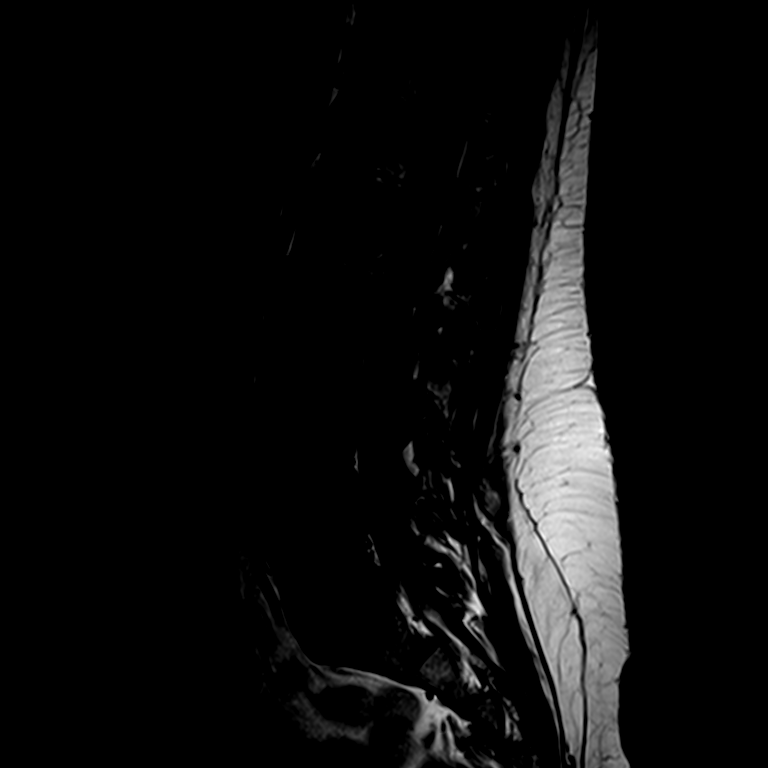
[im 15/15]
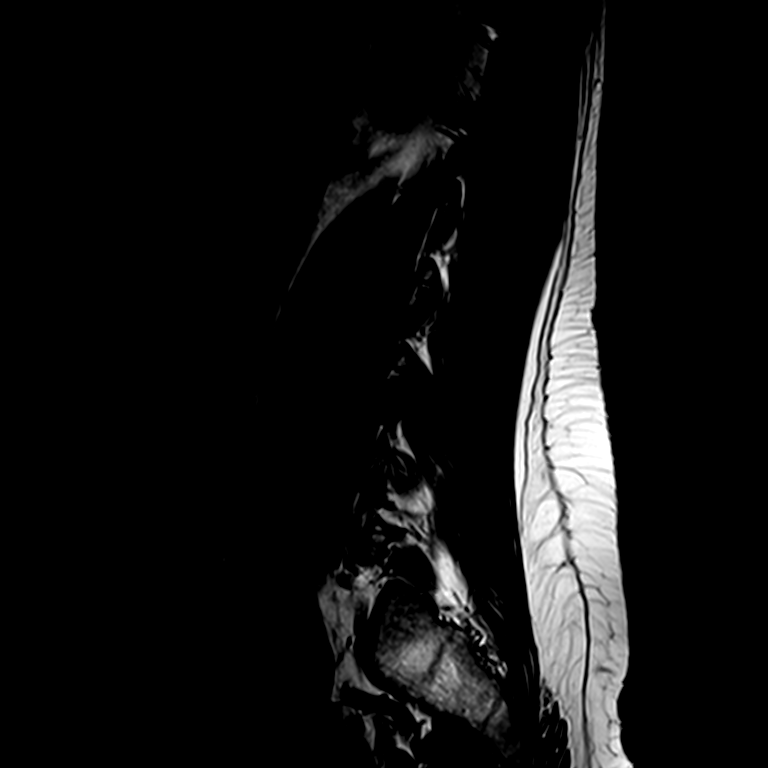

[Series 6: T2 · axial · 4.0mm · 0.23mm/px · z∈[-55,+100]mm · 3 of 41 slices shown (2 of 2)]
[im 6/41]
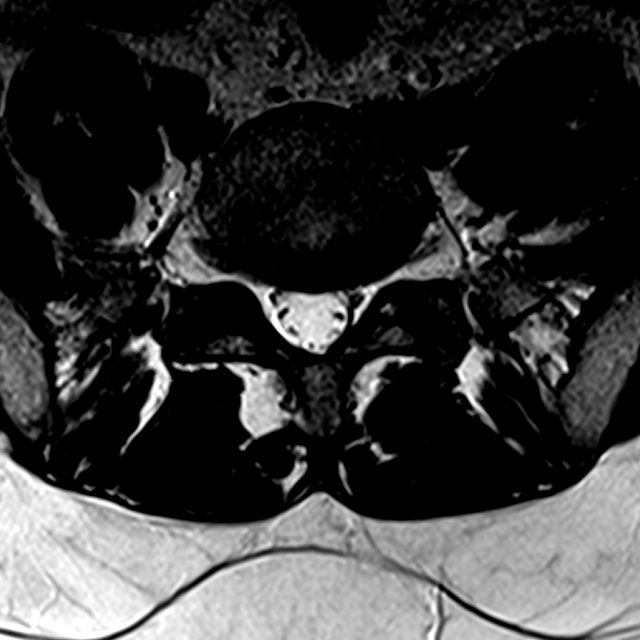
[im 21/41]
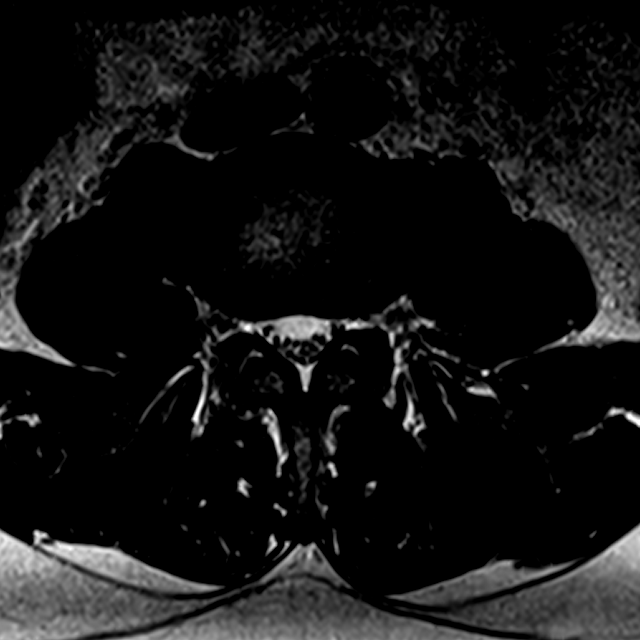
[im 35/41]
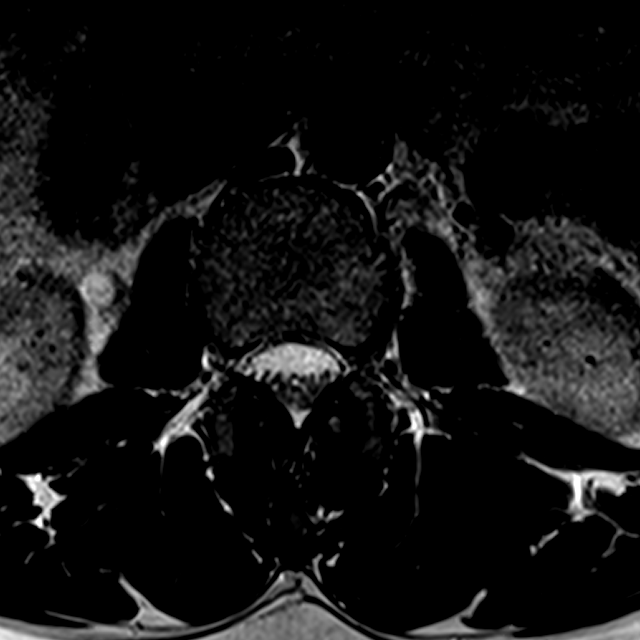

[Series 7: T1 · axial · 4.0mm · 0.24mm/px · z∈[-57,+101]mm · 3 of 41 slices shown (2 of 2)]
[im 6/41]
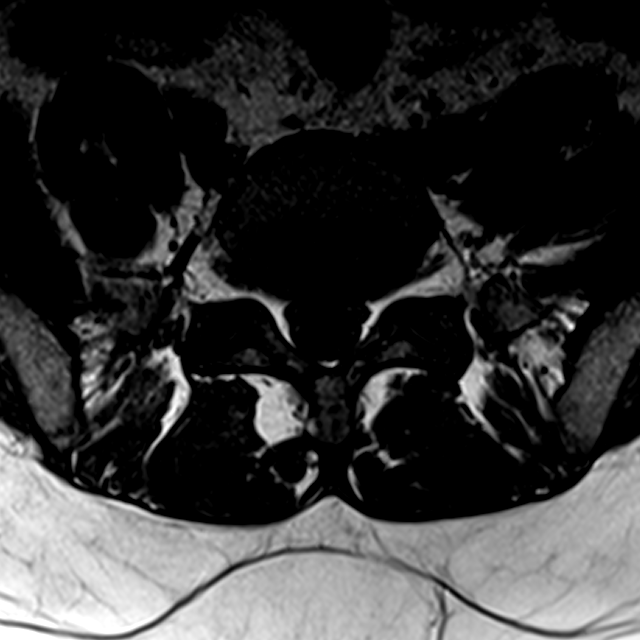
[im 21/41]
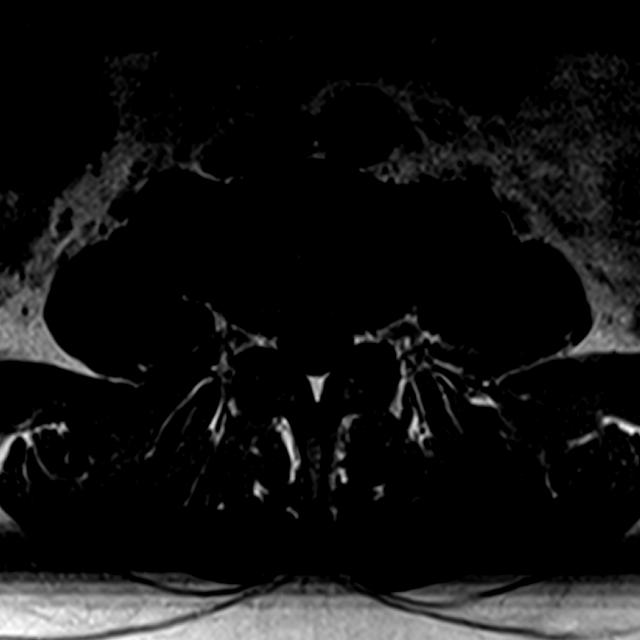
[im 35/41]
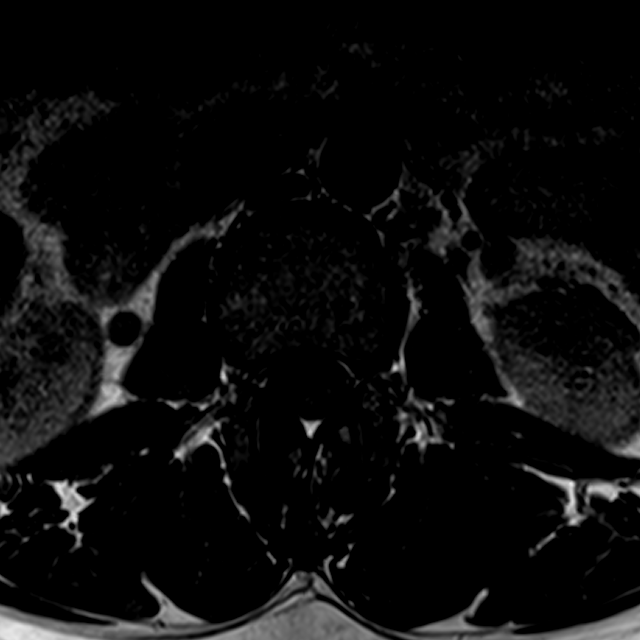

[15 of 48 positions shown; findings below may reference images not displayed]

FINDINGS: Five lumbar type vertebral bodies are assumed. The alignment is
normal. There is no evidence of fracture or pars defect. The lumbar
pedicles are somewhat short on a congenital basis.

The conus medullaris extends to the L1-2 level and appears normal.
No paraspinal abnormalities are identified.

L1-2: Disc height and hydration are maintained. There is minimal
Schmorl's node formation. No spinal stenosis or nerve root
encroachment.

L2-3: Mild disc bulging. No spinal stenosis or nerve root
encroachment.

L3-4: Mild disc bulging eccentric to the right. There is potential
extraforaminal right L3 nerve root encroachment. There is mild facet
and ligamentous hypertrophy. The lateral recesses and left foramen
appear adequate patent.

L4-5: Mild disc bulging, facet and ligamentous hypertrophy. No
significant spinal stenosis or nerve root encroachment.

L5-S1: Disc height and hydration are maintained. Mild bilateral
facet hypertrophy. No spinal stenosis or nerve root encroachment.
IMPRESSION: 1. No high-grade spinal stenosis or focal disc herniation.
2. There is mild spondylosis superimposed on a congenitally small
spinal canal. At L3-4, extraforaminal right L3 nerve root
encroachment is possible by asymmetric disc bulging laterally. No
other evidence of nerve root encroachment.
3. No acute osseous findings or malalignment.

## 2016-04-04 ENCOUNTER — Other Ambulatory Visit: Payer: Self-pay | Admitting: Family Medicine

## 2016-04-04 NOTE — Telephone Encounter (Signed)
Last office visit 03/02/16.  Last refilled 03/05/16 for #90 with no refills. Ok to refill?

## 2016-04-06 ENCOUNTER — Telehealth: Payer: Self-pay

## 2016-04-06 MED ORDER — SERTRALINE HCL 100 MG PO TABS: 200.0000 mg | ORAL_TABLET | Freq: Every day | ORAL | 1 refills | Status: DC

## 2016-04-06 NOTE — Telephone Encounter (Signed)
Okay to increase sertraline to 200 mg daily. IRefill prescription as needed. Will follow up on mood at upcoming OV.

## 2016-04-06 NOTE — Telephone Encounter (Signed)
[facility] [place] 

## 2016-04-06 NOTE — Telephone Encounter (Signed)
Pt left v/m; pt is feeling irritable and pt wants to know if could increase zoloft from 150 mg to . Pt has appt 05/07/16 with Dr Ermalene Searing. Please advise. [facility].

## 2016-04-06 NOTE — Telephone Encounter (Signed)
Olden notified as instructed by telephone.  [facility] [place]

## 2016-04-13 NOTE — Telephone Encounter (Signed)
Have him make follow up appt for re-eval. 30 min

## 2016-04-13 NOTE — Telephone Encounter (Signed)
Appointment scheduled for 04/16/2016 at 4:00pm with Dr. Ermalene SearingBedsole.

## 2016-04-13 NOTE — Telephone Encounter (Signed)
Left message for Christopher Hatfield to call back and schedule 30 minute office visit with Dr. Ermalene SearingBedsole to re-evaluate his back pain.

## 2016-04-13 NOTE — Telephone Encounter (Signed)
Pt left v/m; pt has tried the increased dose of gabapentin and pt cannot tell any difference in how feeling. Pt request cb with what to do next. [facility].

## 2016-04-16 ENCOUNTER — Encounter: Payer: Self-pay | Admitting: Family Medicine

## 2016-04-16 ENCOUNTER — Ambulatory Visit (INDEPENDENT_AMBULATORY_CARE_PROVIDER_SITE_OTHER): Payer: BLUE CROSS/BLUE SHIELD | Admitting: Family Medicine

## 2016-04-16 VITALS — BP 110/64 | HR 73 | Temp 98.6°F | Wt 192.5 lb

## 2016-04-16 DIAGNOSIS — G8929 Other chronic pain: Secondary | ICD-10-CM

## 2016-04-16 DIAGNOSIS — F331 Major depressive disorder, recurrent, moderate: Secondary | ICD-10-CM | POA: Diagnosis not present

## 2016-04-16 DIAGNOSIS — M5416 Radiculopathy, lumbar region: Secondary | ICD-10-CM | POA: Diagnosis not present

## 2016-04-16 MED ORDER — DULOXETINE HCL 60 MG PO CPEP: 60.0000 mg | ORAL_CAPSULE | Freq: Every day | ORAL | 3 refills | Status: DC

## 2016-04-16 MED ORDER — PREDNISONE 20 MG PO TABS: ORAL_TABLET | ORAL | 0 refills | Status: DC

## 2016-04-16 NOTE — Patient Instructions (Addendum)
Stop sertraline and start  cymbalta instead.  Start prednisone taper.  Continue other medicines.  Follow up in 1-2 months 30 min OV.

## 2016-04-16 NOTE — Assessment & Plan Note (Signed)
Good control on sertraline 200 mg daily.. Stop and change to 60 mg cymbalta. May need to increase to 90 mg daily for mood control.

## 2016-04-16 NOTE — Assessment & Plan Note (Addendum)
Given worsening.. Treat with prednsione taper.  Will also try to change to cymbalta for better control of pain. COntinue gabapentin for now, but pt not sure it helped.  If still worsening.. Consider repeat MRI.   Discussed with pt that he will likely not be pain free.. Goal is to make pain more tolerable.

## 2016-04-16 NOTE — Progress Notes (Signed)
   Subjective:    Patient ID: Christopher Hatfield, male    DOB: 1981-04-23, 35 y.o.   MRN: [id number]  HPI  35 year old male with chronic pain  And MDDreturns for follow up.  Major depressive disorder:  Overall feels even keel with mood.. Is irritable at times given back pain  On sertraline to 200 mg daily. Referred to counseling.    Chronic radicular low back pain: Followed by Dr. Ethelene Halamos in past. In past given ESI injections, did not help. No surgical indications on MRI 2015 Using tramadol  three times a day. Unable to decrease tramadol use and still function.  Pain worse lately .Marland Kitchen. Over the phone increase of gabapentin over time to 100/100/300. He reports pain is no better. No SE at this dose.  Pain waking him up at night, cannot get comfortable. Low back pain.. Constant ache and burn.  Pain more frequently radiates to left leg now. No new numbness or weakness. Flexeril at bedtime. Home PT. Lyrica to expensive.  Never tried amitryptiline or cymablta in past.  SSRI has not helped back pain at all.   MRI 2015: IMPRESSION: 1. No high-grade spinal stenosis or focal disc herniation. 2. 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 At L3-4, extraforaminal right L3 nerve root encroachment is possible by asymmetric disc bulging laterally. No other evidence of nerve root encroachment. 3. No acute osseous findings or malalignment.  Review of Systems  Constitutional: Negative for fatigue.  HENT: Negative for ear pain.   Eyes: Negative for pain.  Respiratory: Negative for shortness of breath.   Cardiovascular: Negative for chest pain.       Objective:   Physical Exam  Constitutional: Vital signs are normal. He appears well-developed and well-nourished.  HENT:  Head: Normocephalic.  Right Ear: Hearing normal.  Left Ear: Hearing normal.  Nose: Nose normal.  Mouth/Throat: Oropharynx is clear and moist and mucous membranes are normal.  Neck: Trachea normal.  Carotid bruit is not present. No thyroid mass and no thyromegaly present.  Cardiovascular: Normal rate, regular rhythm and normal pulses.  Exam reveals no gallop, no distant heart sounds and no friction rub.   No murmur heard. No peripheral edema  Pulmonary/Chest: Effort normal and breath sounds normal. No respiratory distress.  Musculoskeletal:       Lumbar back: He exhibits decreased range of motion. He exhibits no tenderness and no bony tenderness.  Positive SLR on left  Neurological: He has normal strength. No sensory deficit. He exhibits normal muscle tone. He displays a negative Romberg sign. Gait abnormal. Coordination normal.  Skin: Skin is warm, dry and intact. No rash noted.  Psychiatric: He has a normal mood and affect. His speech is normal and behavior is normal. Thought content normal.          Assessment & Plan:

## 2016-04-16 NOTE — Progress Notes (Signed)
Pre visit review using our clinic review tool, if applicable. No additional management support is needed unless otherwise documented below in the visit note. 

## 2016-04-22 ENCOUNTER — Telehealth: Payer: Self-pay | Admitting: Family Medicine

## 2016-04-22 NOTE — Telephone Encounter (Signed)
Pt called asking to establish care for his 2 children. He said he's been with you for a long time and really wants to est care with you for his children, Marin OlpChristopher Cobin 03/25/00 and Enterprise ProductsVictoria Misiaszek 08/06/01. No issues except needs cpe and TurkeyVictoria has minor knee issues. Please advise if willing to est, can I make appt before end of year.

## 2016-04-22 NOTE — Telephone Encounter (Signed)
Yes, okay to work in in 30 min slot when able

## 2016-04-27 ENCOUNTER — Telehealth: Payer: Self-pay | Admitting: *Deleted

## 2016-04-27 MED ORDER — DULOXETINE HCL 30 MG PO CPEP: 30.0000 mg | ORAL_CAPSULE | Freq: Every day | ORAL | 3 refills | Status: DC

## 2016-04-27 NOTE — Telephone Encounter (Signed)
I spoke with Marcial Pacasimothy and got Cristal DeerChristopher and TurkeyVictoria scheduled by end of year.

## 2016-04-27 NOTE — Telephone Encounter (Signed)
Patient left a voicemail stating that he was seen over a week ago and given Prednisone for his back pain. Patient stated that his back pain is not any better. Patient stated that he is also having some lower abdominal pain that comes and goes. Patient stated that he does not know if you want to go up on his Gabapentin since the Prednisone has not helped. Patient stated that he does not know if he needs to come back in or can this be handled over the phone?

## 2016-04-27 NOTE — Telephone Encounter (Signed)
If he is tolerating cymbalta.. We can consider increasing to 90 mg daily. Keep follow up appt as scheduled.

## 2016-04-27 NOTE — Telephone Encounter (Signed)
Spoke with Christopher Hatfield.  He states he is tolerating the Cymbalta fine and is agreeable to increasing it to 90 mg daily.  Prescription for Cymbalta 30 mg sent to pharmacy for patient to take along with the 60 mg capsule.  Follow up appointment scheduled for 05/25/2016 at 9:00 am with Dr. Ermalene SearingBedsole.

## 2016-05-05 ENCOUNTER — Other Ambulatory Visit: Payer: Self-pay | Admitting: Family Medicine

## 2016-05-05 NOTE — Telephone Encounter (Signed)
Last office visit 04/16/2016.  Last refilled 04/06/2016 for #90 with no refills.  Ok to refill?

## 2016-05-07 ENCOUNTER — Ambulatory Visit: Payer: BLUE CROSS/BLUE SHIELD | Admitting: Family Medicine

## 2016-05-07 NOTE — Telephone Encounter (Addendum)
[facility]amadol called into [facility]. 

## 2016-05-18 NOTE — Telephone Encounter (Signed)
Kayler notified as instructed by telephone.

## 2016-05-18 NOTE — Telephone Encounter (Signed)
He can try increasing gabapentin over time to 200 in AM, 200 midday and continue  300mg  at night.  Duloxetine or gabapentin can cause that SE. If he does not think higher dose duloxetine helped.. We can have him decrease down back to 60 mg daily.

## 2016-05-18 NOTE — Telephone Encounter (Signed)
Pt left v/m; pt has appt on 05/25/16; pt still having back pain and pt wondered if could increase gabapentin until seen to see if that would help pain. Pt also having problems focusing and wondered if the duloxetine might be causing that. Pt request cb. [facility].

## 2016-05-25 ENCOUNTER — Ambulatory Visit (INDEPENDENT_AMBULATORY_CARE_PROVIDER_SITE_OTHER): Payer: BLUE CROSS/BLUE SHIELD | Admitting: Family Medicine

## 2016-05-25 ENCOUNTER — Encounter: Payer: Self-pay | Admitting: Family Medicine

## 2016-05-25 VITALS — BP 118/76 | HR 54 | Temp 98.5°F | Ht 68.5 in | Wt 184.5 lb

## 2016-05-25 DIAGNOSIS — F331 Major depressive disorder, recurrent, moderate: Secondary | ICD-10-CM

## 2016-05-25 DIAGNOSIS — G8929 Other chronic pain: Secondary | ICD-10-CM

## 2016-05-25 DIAGNOSIS — M5416 Radiculopathy, lumbar region: Secondary | ICD-10-CM | POA: Diagnosis not present

## 2016-05-25 NOTE — Assessment & Plan Note (Signed)
Stop cymbalta.. Restart sertraline high dose.

## 2016-05-25 NOTE — Assessment & Plan Note (Signed)
Improved some on gabapentin higher dose. Using tramadol prn. Restart home PT.  Cymbalta was not helpful and controlled mood poorly.   Pt not interested in re-eval with MRI at this time.

## 2016-05-25 NOTE — Progress Notes (Signed)
Pre visit review using our clinic review tool, if applicable. No additional management support is needed unless otherwise documented below in the visit note. 

## 2016-05-25 NOTE — Progress Notes (Signed)
Subjective:    Patient ID: Christopher Hatfield, male    DOB: 02-28-1981, 35 y.o.   MRN: [id number]  HPI   35 year old male presents for follow up 1 month chronic back pain.   Summary of last OV: Chronic radicular low back pain: Followed by Dr. Ethelene Halamos in past. In past given ESI injections, did not help. No surgical indications on MRI 2015 Using tramadol three times a day. Unable to decrease tramadol use and still function. Pain worse lately .Marland Kitchen. Over the phone increase of gabapentin over time to 100/100/300. He reports pain is no better. No SE at this dose.  Pain waking him up at night, cannot get comfortable. Low back pain.. Constant ache and burn. Pain more frequently radiates to left leg now. No new numbness or weakness. Flexeril at bedtime. Home PT. Lyrica to expensive. SSRI did not help with pain in past.  MRI 2015: IMPRESSION: 1. No high-grade spinal stenosis or focal disc herniation. 2. 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 At L3-4, extraforaminal right L3 nerve root encroachment is possible by asymmetric disc bulging laterally. No other evidence of nerve root encroachment. 3. No acute osseous findings or malalignment.    Since last OV he has been increasing gabapentin and is now on 2/2/3  At last OV he was started on cymbalta , titrated to 90 mg.. Did not help. Had so decreased back to 60 mg daily.   Today he reports:  His mood worsened since switching to Cymbalta from sertraline. Even high dose. More irritable, sad, depressed, all depression symptoms have returned.  Sleeping okay at night with melatonin.No SI, no HI.  Back pain is not waking him up at night.  Back pain continues but not as much radiation of pain to left leg. Only every few days.  Still constant pain, ache in low back 4/10 daily, occ sharper pain 7-8/10 on pain scale.  No SE associated with gabapentin.  He is not interested in repeat MRI at this point given cost. Using  tramadol for pain TID.   Review of Systems  Constitutional: Positive for fatigue.  HENT: Negative for ear pain.   Eyes: Negative for pain.  Respiratory: Negative for shortness of breath.   Cardiovascular: Negative for chest pain.       Objective:   Physical Exam  Constitutional: Vital signs are normal. He appears well-developed and well-nourished.  HENT:  Head: Normocephalic.  Right Ear: Hearing normal.  Left Ear: Hearing normal.  Nose: Nose normal.  Mouth/Throat: Oropharynx is clear and moist and mucous membranes are normal.  Neck: Trachea normal. Carotid bruit is not present. No thyroid mass and no thyromegaly present.  Cardiovascular: Normal rate, regular rhythm and normal pulses.  Exam reveals no gallop, no distant heart sounds and no friction rub.   No murmur heard. No peripheral edema  Pulmonary/Chest: Effort normal and breath sounds normal. No respiratory distress.  Musculoskeletal:       Lumbar back: He exhibits decreased range of motion. He exhibits no tenderness and no bony tenderness.  slightly positive SLR on left  Neurological: He has normal strength. No sensory deficit. He exhibits normal muscle tone. He displays a negative Romberg sign. Gait abnormal. Coordination normal.  Skin: Skin is warm, dry and intact. No rash noted.  Psychiatric: He has a normal mood and affect. His speech is normal and behavior is normal. Thought content normal.          Assessment & Plan:

## 2016-05-25 NOTE — Patient Instructions (Addendum)
Stop Cymbalta . Restart sertraline 200 mg daily.  Continue gabapentin at current dose. Home physical therapy, strengthening.

## 2016-06-18 ENCOUNTER — Other Ambulatory Visit: Payer: Self-pay

## 2016-06-18 MED ORDER — GABAPENTIN 100 MG PO CAPS: ORAL_CAPSULE | ORAL | 3 refills | Status: DC

## 2016-06-18 NOTE — Telephone Encounter (Signed)
Pt left v/m requestinig new rx for gabapentin with new instructions to [facility]; Dr Ermalene SearingBedsole last saw pt 05/25/16.Please advise.

## 2016-06-18 NOTE — Telephone Encounter (Signed)
Pt notified done and pt voiced understanding. 

## 2016-08-13 ENCOUNTER — Other Ambulatory Visit: Payer: Self-pay | Admitting: Family Medicine

## 2016-08-13 NOTE — Addendum Note (Signed)
Addended by: Damita LackLORING, DONNA S on: 08/13/2016 08:11 AM   Modules accepted: Orders

## 2016-08-27 ENCOUNTER — Ambulatory Visit (INDEPENDENT_AMBULATORY_CARE_PROVIDER_SITE_OTHER): Payer: BLUE CROSS/BLUE SHIELD | Admitting: Family Medicine

## 2016-08-27 ENCOUNTER — Encounter: Payer: Self-pay | Admitting: Family Medicine

## 2016-08-27 DIAGNOSIS — F331 Major depressive disorder, recurrent, moderate: Secondary | ICD-10-CM

## 2016-08-27 DIAGNOSIS — G8929 Other chronic pain: Secondary | ICD-10-CM | POA: Diagnosis not present

## 2016-08-27 DIAGNOSIS — M5416 Radiculopathy, lumbar region: Secondary | ICD-10-CM | POA: Diagnosis not present

## 2016-08-27 NOTE — Assessment & Plan Note (Signed)
Improved and has been able to stop gabapentin and tramadol.  Encouraged core strengthening and back care. Info given.

## 2016-08-27 NOTE — Patient Instructions (Signed)
Work on core strengthening 2-3 times a week.  Work on healthy back care.   Back Exercises The following exercises strengthen the muscles that help to support the back. They also help to keep the lower back flexible. Doing these exercises can help to prevent back pain or lessen existing pain. If you have back pain or discomfort, try doing these exercises 2-3 times each day or as told by your health care provider. When the pain goes away, do them once each day, but increase the number of times that you repeat the steps for each exercise (do more repetitions). If you do not have back pain or discomfort, do these exercises once each day or as told by your health care provider. Exercises Single Knee to Chest   Repeat these steps 3-5 times for each leg: 1. Lie on your back on a firm bed or the floor with your legs extended. 2. Bring one knee to your chest. Your other leg should stay extended and in contact with the floor. 3. Hold your knee in place by grabbing your knee or thigh. 4. Pull on your knee until you feel a gentle stretch in your lower back. 5. Hold the stretch for 10-30 seconds. 6. Slowly release and straighten your leg. Pelvic Tilt   Repeat these steps 5-10 times: 1. Lie on your back on a firm bed or the floor with your legs extended. 2. Bend your knees so they are pointing toward the ceiling and your feet are flat on the floor. 3. Tighten your lower abdominal muscles to press your lower back against the floor. This motion will tilt your pelvis so your tailbone points up toward the ceiling instead of pointing to your feet or the floor. 4. With gentle tension and even breathing, hold this position for 5-10 seconds. Cat-Cow   Repeat these steps until your lower back becomes more flexible: 1. Get into a hands-and-knees position on a firm surface. Keep your hands under your shoulders, and keep your knees under your hips. You may place padding under your knees for comfort. 2. Let your  head hang down, and point your tailbone toward the floor so your lower back becomes rounded like the back of a cat. 3. Hold this position for 5 seconds. 4. Slowly lift your head and point your tailbone up toward the ceiling so your back forms a sagging arch like the back of a cow. 5. Hold this position for 5 seconds. Press-Ups   Repeat these steps 5-10 times: 1. Lie on your abdomen (face-down) on the floor. 2. Place your palms near your head, about shoulder-width apart. 3. While you keep your back as relaxed as possible and keep your hips on the floor, slowly straighten your arms to raise the top half of your body and lift your shoulders. Do not use your back muscles to raise your upper torso. You may adjust the placement of your hands to make yourself more comfortable. 4. Hold this position for 5 seconds while you keep your back relaxed. 5. Slowly return to lying flat on the floor. Bridges   Repeat these steps 10 times: 1. Lie on your back on a firm surface. 2. Bend your knees so they are pointing toward the ceiling and your feet are flat on the floor. 3. Tighten your buttocks muscles and lift your buttocks off of the floor until your waist is at almost the same height as your knees. You should feel the muscles working in your buttocks and the back  of your thighs. If you do not feel these muscles, slide your feet 1-2 inches farther away from your buttocks. 4. Hold this position for 3-5 seconds. 5. Slowly lower your hips to the starting position, and allow your buttocks muscles to relax completely. If this exercise is too easy, try doing it with your arms crossed over your chest. Abdominal Crunches   Repeat these steps 5-10 times: 1. Lie on your back on a firm bed or the floor with your legs extended. 2. Bend your knees so they are pointing toward the ceiling and your feet are flat on the floor. 3. Cross your arms over your chest. 4. Tip your chin slightly toward your chest without bending  your neck. 5. Tighten your abdominal muscles and slowly raise your trunk (torso) high enough to lift your shoulder blades a tiny bit off of the floor. Avoid raising your torso higher than that, because it can put too much stress on your low back and it does not help to strengthen your abdominal muscles. 6. Slowly return to your starting position. Back Lifts  Repeat these steps 5-10 times: 1. Lie on your abdomen (face-down) with your arms at your sides, and rest your forehead on the floor. 2. Tighten the muscles in your legs and your buttocks. 3. Slowly lift your chest off of the floor while you keep your hips pressed to the floor. Keep the back of your head in line with the curve in your back. Your eyes should be looking at the floor. 4. Hold this position for 3-5 seconds. 5. Slowly return to your starting position. Contact a health care provider if:  Your back pain or discomfort gets much worse when you do an exercise.  Your back pain or discomfort does not lessen within 2 hours after you exercise. If you have any of these problems, stop doing these exercises right away. Do not do them again unless your health care provider says that you can. Get help right away if:  You develop sudden, severe back pain. If this happens, stop doing the exercises right away. Do not do them again unless your health care provider says that you can. This information is not intended to replace advice given to you by your health care provider. Make sure you discuss any questions you have with your health care provider. Document Released: 07/22/2004 Document Revised: 10/22/2015 Document Reviewed: 08/08/2014 Elsevier Interactive Patient Education  2017 [employer] Injury Prevention Back injuries can be very painful. They can also be difficult to heal. After having one back injury, you are more likely to injure your back again. It is important to learn how to avoid injuring or re-injuring your back. The  following tips can help you to prevent a back injury. What should I know about physical fitness?  Exercise for 30 minutes per day on most days of the week or as directed by your health care provider. Make sure to:  Do aerobic exercises, such as walking, jogging, biking, or swimming.  Do exercises that increase balance and strength, such as tai chi and yoga. These can decrease your risk of falling and injuring your back.  Do stretching exercises to help with flexibility.  Try to develop strong abdominal muscles. Your abdominal muscles provide a lot of the support that is needed by your back.  Maintain a healthy weight. This helps to decrease your risk of a back injury. What should I know about my diet?  Talk with your health care provider  about your overall diet. Take supplements and vitamins only as directed by your health care provider.  Talk with your health care provider about how much calcium and vitamin D you need each day. These nutrients help to prevent weakening of the bones (osteoporosis). Osteoporosis can cause broken (fractured) bones, which lead to back pain.  Include good sources of calcium in your diet, such as dairy products, green leafy vegetables, and products that have had calcium added to them (fortified).  Include good sources of vitamin D in your diet, such as milk and foods that are fortified with vitamin D. What should I know about my posture?  Sit up straight and stand up straight. Avoid leaning forward when you sit or hunching over when you stand.  Choose chairs that have good low-back (lumbar) support.  If you work at a desk, sit close to it so you do not need to lean over. Keep your chin tucked in. Keep your neck drawn back, and keep your elbows bent at a right angle. Your arms should look like the letter "L."  Sit high and close to the steering wheel when you drive. Add a lumbar support to your car seat, if needed.  Avoid sitting or standing in one  position for very long. Take breaks to get up, stretch, and walk around at least one time every hour. Take breaks every hour if you are driving for long periods of time.  Sleep on your side with your knees slightly bent, or sleep on your back with a pillow under your knees. Do not lie on the front of your body to sleep. What should I know about lifting, twisting, and reaching? Lifting and Heavy Lifting    Avoid heavy lifting, especially repetitive heavy lifting. If you must do heavy lifting:  Stretch before lifting.  Work slowly.  Rest between lifts.  Use a tool such as a cart or a dolly to move objects if one is available.  Make several small trips instead of carrying one heavy load.  Ask for help when you need it, especially when moving big objects.  Follow these steps when lifting:  Stand with your feet shoulder-width apart.  Get as close to the object as you can. Do not try to pick up a heavy object that is far from your body.  Use handles or lifting straps if they are available.  Bend at your knees. Squat down, but keep your heels off the floor.  Keep your shoulders pulled back, your chin tucked in, and your back straight.  Lift the object slowly while you tighten the muscles in your legs, abdomen, and buttocks. Keep the object as close to the center of your body as possible.  Follow these steps when putting down a heavy load:  Stand with your feet shoulder-width apart.  Lower the object slowly while you tighten the muscles in your legs, abdomen, and buttocks. Keep the object as close to the center of your body as possible.  Keep your shoulders pulled back, your chin tucked in, and your back straight.  Bend at your knees. Squat down, but keep your heels off the floor.  Use handles or lifting straps if they are available. Twisting and Reaching   Avoid lifting heavy objects above your waist.  Do not twist at your waist while you are lifting or carrying a load. If  you need to turn, move your feet.  Do not bend over without bending at your knees.  Avoid reaching over your  head, across a table, or for an object on a high surface. What are some other tips?  Avoid wet floors and icy ground. Keep sidewalks clear of ice to prevent falls.  Do not sleep on a mattress that is too soft or too hard.  Keep items that are used frequently within easy reach.  Put heavier objects on shelves at waist level, and put lighter objects on lower or higher shelves.  Find ways to decrease your stress, such as exercise, massage, or relaxation techniques. Stress can build up in your muscles. Tense muscles are more vulnerable to injury.  Talk with your health care provider if you feel anxious or depressed. These conditions can make back pain worse.  Wear flat heel shoes with cushioned soles.  Avoid sudden movements.  Use both shoulder straps when carrying a backpack.  Do not use any tobacco products, including cigarettes, chewing tobacco, or electronic cigarettes. If you need help quitting, ask your health care provider. This information is not intended to replace advice given to you by your health care provider. Make sure you discuss any questions you have with your health care provider. Document Released: 07/22/2004 Document Revised: 11/20/2015 Document Reviewed: 06/18/2014 Elsevier Interactive Patient Education  2017 [employer].

## 2016-08-27 NOTE — Assessment & Plan Note (Signed)
Continue on 150 mg daily of sertraline.. Wean down over time if able.

## 2016-08-27 NOTE — Progress Notes (Signed)
   Subjective:    Patient ID: Christopher Hatfield, male    DOB: 1980/10/19, 36 y.o.   MRN: [id number]  HPI   [age] year old male presents for 3 months follow up  Chronic bac pain and depression.   He has improved his mood overall.  PHQ 9 down to 5/30 He has started weaning down to 150 mg daily.  he has had an improvement in home life stress.   His pain has improved overall. He has been able to stop tramadol and gabapentin overall. He feels better overall.  Pain is more tolerable  Review of Systems  Constitutional: Negative for fatigue.  HENT: Negative for ear pain.   Eyes: Negative for pain.  Respiratory: Negative for shortness of breath.   Cardiovascular: Negative for chest pain.  Gastrointestinal: Negative for abdominal distention.       Objective:   Physical Exam  Constitutional: Vital signs are normal. He appears well-developed and well-nourished.  HENT:  Head: Normocephalic.  Right Ear: Hearing normal.  Left Ear: Hearing normal.  Nose: Nose normal.  Mouth/Throat: Oropharynx is clear and moist and mucous membranes are normal.  Neck: Trachea normal. Carotid bruit is not present. No thyroid mass and no thyromegaly present.  Cardiovascular: Normal rate, regular rhythm and normal pulses.  Exam reveals no gallop, no distant heart sounds and no friction rub.   No murmur heard. No peripheral edema  Pulmonary/Chest: Effort normal and breath sounds normal. No respiratory distress.  Skin: Skin is warm, dry and intact. No rash noted.  Psychiatric: He has a normal mood and affect. His speech is normal and behavior is normal. Thought content normal.          Assessment & Plan:

## 2016-08-27 NOTE — Progress Notes (Signed)
Pre visit review using our clinic review tool, if applicable. No additional management support is needed unless otherwise documented below in the visit note. 

## 2017-02-03 ENCOUNTER — Other Ambulatory Visit: Payer: Self-pay | Admitting: Family Medicine

## 2017-02-11 ENCOUNTER — Encounter: Payer: Self-pay | Admitting: Family Medicine

## 2017-02-11 ENCOUNTER — Ambulatory Visit (INDEPENDENT_AMBULATORY_CARE_PROVIDER_SITE_OTHER): Payer: BLUE CROSS/BLUE SHIELD | Admitting: Family Medicine

## 2017-02-11 DIAGNOSIS — M7711 Lateral epicondylitis, right elbow: Secondary | ICD-10-CM | POA: Diagnosis not present

## 2017-02-11 DIAGNOSIS — M7581 Other shoulder lesions, right shoulder: Secondary | ICD-10-CM | POA: Insufficient documentation

## 2017-02-11 MED ORDER — DICLOFENAC SODIUM 75 MG PO TBEC: 75.0000 mg | DELAYED_RELEASE_TABLET | Freq: Two times a day (BID) | ORAL | 0 refills | Status: DC

## 2017-02-11 NOTE — Assessment & Plan Note (Signed)
Due to overuse. Start NSAID , ice and  Home PT.

## 2017-02-11 NOTE — Assessment & Plan Note (Signed)
No evidence of tear., Treat with NSAIDs, limitations, home PT and ice prn.  If not improving consider referral for steroid injection.

## 2017-02-11 NOTE — Progress Notes (Signed)
   Subjective:    Patient ID: Christopher Hatfield, male    DOB: 28-Sep-1980, 36 y.o.   MRN: [id number]  HPI    36 year old male presents with right elbow pain x 1 month  Progressing to right shoulder pain in last week. 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 He is very active with arms.   Pain over shoulder.. Most with abduction, reaching over head, pain with int and ext rotation. Pain radiates to forearm.  Pain not triggered with neck movement, minimal neck pain. No weakness and no numbness.  right elbow pain better.. But still tender in lateral elbow.  Using ibuprofen  800 mg or tyelnol with minimal relief. Causes some indigestion.  Blood pressure 118/76, pulse 75, weight 211 lb (95.7 kg), SpO2 98 %.    Review of Systems  Constitutional: Negative for fatigue.  HENT: Negative for ear pain.   Eyes: Negative for pain.  Respiratory: Negative for shortness of breath.   Cardiovascular: Negative for chest pain.       Objective:   Physical Exam  Constitutional: Vital signs are normal. He appears well-developed and well-nourished.  HENT:  Head: Normocephalic.  Right Ear: Hearing normal.  Left Ear: Hearing normal.  Nose: Nose normal.  Mouth/Throat: Oropharynx is clear and moist and mucous membranes are normal.  Neck: Trachea normal. Carotid bruit is not present. No thyroid mass and no thyromegaly present.  Cardiovascular: Normal rate, regular rhythm and normal pulses.  Exam reveals no gallop, no distant heart sounds and no friction rub.   No murmur heard. No peripheral edema  Pulmonary/Chest: Effort normal and breath sounds normal. No respiratory distress.  Musculoskeletal:       Right shoulder: He exhibits decreased range of motion and tenderness. He exhibits no bony tenderness.       Right elbow: He exhibits normal range of motion, no swelling, no effusion, no deformity and no laceration. Tenderness found. Lateral epicondyle tenderness noted.       Cervical back: Normal.  He exhibits normal range of motion, no tenderness and no bony tenderness.  Pain with neers, int, ext rot and abduction  negative spurling and drop arm test.  Skin: Skin is warm, dry and intact. No rash noted.  Psychiatric: He has a normal mood and affect. His speech is normal and behavior is normal. Thought content normal.          Assessment & Plan:

## 2017-02-11 NOTE — Patient Instructions (Signed)
Start anti-inflammatory twice daily x 2 weeks. Start home PT/stretched. Limit above head reaching, int and ext rotation repetitively with weight in hands. No lifting > 10 lbs. Call if not better in 2 weeks for a referral for a steroid.

## 2017-02-25 ENCOUNTER — Telehealth: Payer: Self-pay

## 2017-02-25 MED ORDER — SERTRALINE HCL 100 MG PO TABS: 100.0000 mg | ORAL_TABLET | Freq: Every day | ORAL | 1 refills | Status: DC

## 2017-02-25 NOTE — Telephone Encounter (Signed)
Pt left v/m; pt last seen to discuss sertraline was 08/27/16 and has had acute visit for different issue on 02/11/17. Pt has weined himself off the sertraline; pt has been off sertraline for 2 - 3 weeks. Pt felt a lot better when taking sertraline and request to restart sertraline to [facility] [place]. Pt request cb when done.

## 2017-02-25 NOTE — Telephone Encounter (Signed)
Okay to refill for 6 months, start back at previous dose

## 2017-02-25 NOTE — Telephone Encounter (Signed)
Rx for Sertraline 100 mg, take one tablet daily, sent into [facility] in [place].  Mr. Cliffton AstersWhite notified by telephone.

## 2017-03-04 ENCOUNTER — Encounter: Payer: Self-pay | Admitting: Family Medicine

## 2017-03-04 ENCOUNTER — Ambulatory Visit (INDEPENDENT_AMBULATORY_CARE_PROVIDER_SITE_OTHER): Payer: BLUE CROSS/BLUE SHIELD | Admitting: Family Medicine

## 2017-03-04 VITALS — BP 104/80 | HR 62 | Temp 97.5°F | Ht 67.75 in | Wt 205.0 lb

## 2017-03-04 DIAGNOSIS — J302 Other seasonal allergic rhinitis: Secondary | ICD-10-CM | POA: Diagnosis not present

## 2017-03-04 DIAGNOSIS — F331 Major depressive disorder, recurrent, moderate: Secondary | ICD-10-CM | POA: Diagnosis not present

## 2017-03-04 DIAGNOSIS — Z13220 Encounter for screening for lipoid disorders: Secondary | ICD-10-CM

## 2017-03-04 DIAGNOSIS — E669 Obesity, unspecified: Secondary | ICD-10-CM | POA: Diagnosis not present

## 2017-03-04 DIAGNOSIS — Z113 Encounter for screening for infections with a predominantly sexual mode of transmission: Secondary | ICD-10-CM

## 2017-03-04 DIAGNOSIS — Z0000 Encounter for general adult medical examination without abnormal findings: Secondary | ICD-10-CM | POA: Diagnosis not present

## 2017-03-04 LAB — COMPREHENSIVE METABOLIC PANEL
AG RATIO: 1.9 (calc) (ref 1.0–2.5)
ALT: 21 U/L (ref 9–46)
AST: 20 U/L (ref 10–40)
Albumin: 4.8 g/dL (ref 3.6–5.1)
Alkaline phosphatase (APISO): 57 U/L (ref 40–115)
BUN: 24 mg/dL (ref 7–25)
CO2: 25 mmol/L (ref 20–32)
CREATININE: 1.05 mg/dL (ref 0.60–1.35)
Calcium: 9.8 mg/dL (ref 8.6–10.3)
Chloride: 102 mmol/L (ref 98–110)
GLUCOSE: 76 mg/dL (ref 65–99)
Globulin: 2.5 g/dL (calc) (ref 1.9–3.7)
Potassium: 4.7 mmol/L (ref 3.5–5.3)
SODIUM: 136 mmol/L (ref 135–146)
TOTAL PROTEIN: 7.3 g/dL (ref 6.1–8.1)
Total Bilirubin: 0.6 mg/dL (ref 0.2–1.2)

## 2017-03-04 LAB — LIPID PANEL
CHOL/HDL RATIO: 2.7 (calc) (ref <5.0)
Cholesterol: 199 mg/dL (ref <200)
HDL: 74 mg/dL (ref >40)
LDL CHOLESTEROL (CALC): 110 mg/dL — AB
NON-HDL CHOLESTEROL (CALC): 125 mg/dL (ref <130)
TRIGLYCERIDES: 63 mg/dL (ref <150)

## 2017-03-04 NOTE — Addendum Note (Signed)
Addended by: Alvina ChouWALSH, Keltin Baird J on: 03/04/2017 02:57 PM   Modules accepted: Orders

## 2017-03-04 NOTE — Patient Instructions (Addendum)
Please stop at the lab to have labs drawn.  Work heart healthy diet, start regular exercise, and weight loss.

## 2017-03-04 NOTE — Addendum Note (Signed)
Addended by: Gregery NaVALENCIA, Talin Feister P on: 03/04/2017 02:54 PM   Modules accepted: Orders

## 2017-03-04 NOTE — Progress Notes (Signed)
Subjective:    Patient ID: Christopher Hatfield, male    DOB: Mar 12, 1981, 36 y.o.   MRN: [id number]  HPI   The patient is here for annual wellness exam and preventative care.    Due for repeat fasting labs.  Major depressive disorder, stable control on sertraline.  good sleep at night.  PHQ9: 7  Allergic rhinitis: On flonase and allegra  Chronic radicular low back pain: Followed by Dr. Ethelene Hal. In past given injections, did not help. Using tramadol  three times a day.  Pain is mostly tolerable, able to work, able to sleep..  Pain occ radiates to left leg. No numbness or weakness. Flexeril at bedtime. Home PT. MRI 2015: IMPRESSION: 1. No high-grade spinal stenosis or focal disc herniation. 2. 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 At L3-4, extraforaminal right L3 nerve root encroachment is possible by asymmetric disc bulging laterally. No other evidence of nerve root encroachment. 3. No acute osseous findings or malalignment.  Social History /Family History/Past Medical History reviewed in detail and updated in EMR if needed. Blood pressure 104/80, pulse 62, temperature (!) 97.5 F (36.4 C), temperature source Oral, height 5' 7.75" (1.721 m), weight 205 lb (93 kg). Body mass index is 31.4 kg/m. Wt Readings from Last 3 Encounters:  03/04/17 205 lb (93 kg)  02/11/17 211 lb (95.7 kg)  08/27/16 208 lb 8 oz (94.6 kg)    Review of Systems  Constitutional: Negative for fatigue and fever.  HENT: Negative for ear pain.   Eyes: Negative for pain.  Respiratory: Negative for cough and shortness of breath.   Cardiovascular: Negative for chest pain, palpitations and leg swelling.  Gastrointestinal: Negative for abdominal pain.  Genitourinary: Negative for dysuria.  Musculoskeletal: Negative for arthralgias.  Neurological: Negative for syncope, light-headedness and headaches.  Psychiatric/Behavioral: Negative for dysphoric mood.       Objective:     Physical Exam  Constitutional: He appears well-developed and well-nourished.  Non-toxic appearance. He does not appear ill. No distress.  HENT:  Head: Normocephalic and atraumatic.  Right Ear: Hearing, tympanic membrane, external ear and ear canal normal.  Left Ear: Hearing, tympanic membrane, external ear and ear canal normal.  Nose: Nose normal.  Mouth/Throat: Uvula is midline, oropharynx is clear and moist and mucous membranes are normal.  Eyes: Pupils are equal, round, and reactive to light. Conjunctivae, EOM and lids are normal. Lids are everted and swept, no foreign bodies found.  Neck: Trachea normal, normal range of motion and phonation normal. Neck supple. Carotid bruit is not present. No thyroid mass and no thyromegaly present.  Cardiovascular: Normal rate, regular rhythm, S1 normal, S2 normal, intact distal pulses and normal pulses.  Exam reveals no gallop.   No murmur heard. Pulmonary/Chest: Breath sounds normal. He has no wheezes. He has no rhonchi. He has no rales.  Abdominal: Soft. Normal appearance and bowel sounds are normal. There is no hepatosplenomegaly. There is no tenderness. There is no rebound, no guarding and no CVA tenderness. No hernia.  Lymphadenopathy:    He has no cervical adenopathy.  Neurological: He is alert. He has normal strength and normal reflexes. No cranial nerve deficit or sensory deficit. Gait normal.  Skin: Skin is warm, dry and intact. No rash noted.  Psychiatric: He has a normal mood and affect. His speech is normal and behavior is normal. Judgment normal.          Assessment & Plan:  The patient's preventative maintenance and  recommended screening tests for an annual wellness exam were reviewed in full today. Brought up to date unless services declined.  Counselled on the importance of diet, exercise, and its role in overall health and mortality. The patient's FH and SH was reviewed, including their home life, tobacco status, and drug and  alcohol status.    Vaccines: Up to date except refused flu vaccine Colon: no early family history. Prostate cancer  in dad at age early 9360s No concerns in genital or rectal area. Nonsmoker.

## 2017-03-04 NOTE — Addendum Note (Signed)
Addended by: Alvina ChouWALSH, Olie Scaffidi J on: 03/04/2017 03:03 PM   Modules accepted: Orders

## 2017-03-04 NOTE — Assessment & Plan Note (Signed)
Stable control on 100 mg daily.

## 2017-03-04 NOTE — Assessment & Plan Note (Signed)
Encouraged exercise, weight loss, healthy eating habits. ? ?

## 2017-03-07 ENCOUNTER — Ambulatory Visit (INDEPENDENT_AMBULATORY_CARE_PROVIDER_SITE_OTHER): Payer: BLUE CROSS/BLUE SHIELD | Admitting: Family Medicine

## 2017-03-07 ENCOUNTER — Encounter: Payer: Self-pay | Admitting: Family Medicine

## 2017-03-07 VITALS — BP 120/80 | HR 63 | Temp 98.2°F | Ht 67.75 in | Wt 213.5 lb

## 2017-03-07 DIAGNOSIS — M25511 Pain in right shoulder: Secondary | ICD-10-CM

## 2017-03-07 DIAGNOSIS — M7581 Other shoulder lesions, right shoulder: Secondary | ICD-10-CM

## 2017-03-07 DIAGNOSIS — M7541 Impingement syndrome of right shoulder: Secondary | ICD-10-CM | POA: Diagnosis not present

## 2017-03-07 MED ORDER — METHYLPREDNISOLONE ACETATE 40 MG/ML IJ SUSP
80.0000 mg | Freq: Once | INTRAMUSCULAR | Status: AC
  Administered 2017-03-07: 80 mg via INTRA_ARTICULAR

## 2017-03-07 MED ORDER — METHYLPREDNISOLONE ACETATE 40 MG/ML IJ SUSP: 80.0000 mg | Freq: Once | INTRAMUSCULAR | Status: DC

## 2017-03-07 NOTE — Progress Notes (Signed)
Dr. Karleen HampshireSpencer T. Herta Hink, MD, CAQ Sports Medicine Primary Care and Sports Medicine [address] [place], [address] Phone: [phone] Fax: [phone]  03/07/2017  Patient: Christopher Hatfield Christopher Hatfield, MRN: [id number], DOB: 25-Dec-1980, 36 y.o.  Primary Physician:  Excell SeltzerBedsole, Amy E, MD   Chief Complaint  Patient presents with  . Shoulder Pain   Subjective:   Christopher Hatfield [name] is a 36 y.o. very pleasant male patient who presents with the following:  R shoulder, voltaren and then ice, and then started again hurting. She works a labor job, and he was using a Environmental education officermotorized drill overhead repetitively, and this initially flared up his shoulder.  He also has used some tools to crank down Bolts, which also flared up his shoulder.  He has pain in the plane of abduction and additionally he has pain with terminal internal range of motion.  No trauma or dislocation.  No prior surgery.  subac inj R vvagal  Past Medical History, Surgical History, Social History, Family History, Problem List, Medications, and Allergies have been reviewed and updated if relevant.  Patient Active Problem List   Diagnosis Date Noted  . Obesity (BMI 30.0-34.9) 03/04/2017  . Lateral epicondylitis of right elbow 02/11/2017  . Right rotator cuff tendinitis 02/11/2017  . Chronic radicular low back pain 07/29/2014  . Allergic rhinitis 01/06/2010  . Depression, major, recurrent, moderate (HCC) 09/25/2007    Past Medical History:  Diagnosis Date  . Carpal tunnel syndrome of right wrist 08/2013    Past Surgical History:  Procedure Laterality Date  . CARPAL TUNNEL RELEASE Left 07/27/2013   Procedure: LEFT CARPAL TUNNEL RELEASE;  Surgeon: Tami RibasKevin R Kuzma, MD;  Location: [facility];  Service: Orthopedics;  Laterality: Left;  . CARPAL TUNNEL RELEASE Right 09/07/2013   Procedure: RIGHT CARPAL TUNNEL RELEASE;  Surgeon: Tami RibasKevin R Kuzma, MD;  Location: [facility];  Service: Orthopedics;  Laterality: Right;    . TONSILLECTOMY    . VASECTOMY    . WISDOM TOOTH EXTRACTION      Social History   Social History  . Marital status: Married    Spouse name: N/A  . Number of children: 2  . Years of education: N/A   Occupational History  . tow truck Corporate investment [employer]   Social History Main Topics  . Smoking status: Former Games developermoker  . Smokeless tobacco: Former NeurosurgeonUser     Comment: quit smoking 6 years ago  . Alcohol use No  . Drug use: No  . Sexual activity: Not on file   Other Topics Concern  . Not on file   Social History Narrative   Regular exercise-- no      Diet: Avoid fast food, some fruits and veggies    Family History  Problem Relation Age of Onset  . Depression Mother   . Deep vein thrombosis Father   . Heart attack Maternal Grandmother   . Diabetes Paternal Grandmother   . Cancer Paternal Grandfather        PROSTATE    Allergies  Allergen Reactions  . Penicillins Swelling  . Sulfonamide Derivatives Swelling    Medication list reviewed and updated in full in [facility].  GEN: No fevers, chills. Nontoxic. Primarily MSK c/o today. MSK: Detailed in the HPI GI: tolerating PO intake without difficulty Neuro: No numbness, parasthesias, or tingling associated. Otherwise the pertinent positives of the ROS are noted above.   Objective:   BP 120/80   Pulse 63  Temp 98.2 F (36.8 C) (Oral)   Ht 5' 7.75" (1.721 m)   Wt 213 lb 8 oz (96.8 kg)   BMI 32.70 kg/m    GEN: Well-developed,well-nourished,in no acute distress; alert,appropriate and cooperative throughout examination HEENT: Normocephalic and atraumatic without obvious abnormalities. Ears, externally no deformities PULM: Breathing comfortably in no respiratory distress EXT: No clubbing, cyanosis, or edema PSYCH: Normally interactive. Cooperative during the interview. Pleasant. Friendly and conversant. Not anxious or depressed appearing. Normal, full affect.  Shoulder: R Inspection: No muscle wasting  or winging Ecchymosis/edema: neg  AC joint, scapula, clavicle: NT Cervical spine: NT, full ROM Spurling's: neg Abduction: full, 5/5 Flexion: full, 5/5 IR, full, lift-off: 5/5 ER at neutral: full, 5/5 AC crossover: neg Neer: pos Hawkins: pos Drop Test: neg Empty Can: pos Supraspinatus insertion: mild-mod T Bicipital groove: NT Speed's: neg Yergason's: neg Sulcus sign: neg Scapular dyskinesis: none C5-T1 intact  Neuro: Sensation intact Grip 5/5   Radiology: No results found.  Assessment and Plan:   Impingement syndrome of right shoulder  Right shoulder pain, unspecified chronicity - Plan: methylPREDNISolone acetate (DEPO-MEDROL) injection 80 mg  Rotator cuff tendinitis, right  Classic impingement, and I think he probably is aggravating his rotator cuff doing the mechanized drill and other movements.  He is having both subacromial and subcoracoid impingement.  Reviewed rehab program  [facility] Injection, R Verbal consent was obtained from the patient. Risks (including rare infection), benefits, and alternatives were explained. Patient prepped with Chloraprep and Ethyl Chloride used for anesthesia. The subacromial space was injected using the posterior approach. The patient tolerated the procedure well and had decreased pain post injection. He did get mildly vagal post procedure, but he recovered in less than 5 minutes after laying down and propping up his feet. Injection: 8 cc of Lidocaine 1% and 2 mL of Depo-Medrol 40 mg. Needle: 22 gauge  Follow-up: No Follow-up on file.  No future appointments.  Meds ordered this encounter  Medications  . DISCONTD: methylPREDNISolone acetate (DEPO-MEDROL) injection 80 mg  . methylPREDNISolone acetate (DEPO-MEDROL) injection 80 mg   Medications Discontinued During This Encounter  Medication Reason  . methylPREDNISolone acetate (DEPO-MEDROL) injection 80 mg    Signed,  Raphel Stickles T. Argentina Kosch, MD   Patient's Medications  New  Prescriptions   No medications on file  Previous Medications   ASPIRIN-SALICYLAMIDE-CAFFEINE (BC HEADACHE) 325-95-16 MG TABS    Take 2 each by mouth daily.   CETIRIZINE (ZYRTEC) 10 MG TABLET    Take 10 mg by mouth daily.   CYCLOBENZAPRINE (FLEXERIL) 10 MG TABLET    Take 1 tablet (10 mg total) by mouth at bedtime as needed for muscle spasms.   DICLOFENAC (VOLTAREN) 75 MG EC TABLET    Take 1 tablet (75 mg total) by mouth 2 (two) times daily.   FLUTICASONE (FLONASE) 50 MCG/ACT NASAL SPRAY    PLACE 2 SPRAYS INTO BOTH NOSTRILS DAILY.   SERTRALINE (ZOLOFT) 100 MG TABLET    Take 1 tablet (100 mg total) by mouth daily.  Modified Medications   No medications on file  Discontinued Medications   No medications on file

## 2017-03-08 ENCOUNTER — Telehealth: Payer: Self-pay

## 2017-03-08 NOTE — Telephone Encounter (Signed)
I spoke with pt and the benadryl helped a lot and pt has not rash, difficulty breathing,swelling in throat, tongue or mouth and pt said itching just a little bit now. Pt will monitor and if needs appt pt will cb; if develops swelling in mouth,tongue or throat or difficulty breathing pt will go to ED.FYI to Dr Ermalene SearingBedsole.

## 2017-03-08 NOTE — Telephone Encounter (Signed)
PLEASE NOTE: All timestamps contained within this report are represented as [place] Standard Time. CONFIDENTIALTY NOTICE: This fax transmission is intended only for the addressee. It contains information that is legally privileged, confidential or otherwise protected from use or disclosure. If you are not the intended recipient, you are strictly prohibited from reviewing, disclosing, copying using or disseminating any of this information or taking any action in reliance on or regarding this information. If you have received this fax in error, please notify us immediately by telephone so that we can arrange for its return to us. Phone: [phone], Toll-Free: [phone], Fax: [phone] Page: 1 of 2 Call Id: [id number] [facility] Night - Client TELEPHONE ADVICE RECORD [facility] Patient Name: Christopher Hatfield Gender: Male DOB: 1981/03/04 Age: [age] Y 5 M 16 D Return Phone Number: [phone] (Primary) City/State/Zip: [place] [address] Client [facility] Night - Client Client Site [facility] - Night Physician Copland, Karleen HampshireSpencer - MD Who Is Calling Patient / Member / Family / Caregiver Call Type Triage / Clinical Relationship To Patient Self Return Phone Number [phone] (Primary) Chief Complaint Itching Reason for Call Symptomatic / Request for Health Information Initial Comment Caller said he was seen today and got a cortisone shot. Now he is having a lot of itching. Nurse Assessment Nurse: [name], [name], [name] Date/Time (Eastern Time): 03/07/2017 7:11:03 PM Confirm and document reason for call. If symptomatic, describe symptoms. ---Caller said he was seen today and got a cortisone shot (for tendonitis in R shoulder). Itching started around 4pm. Now he is having a lot of itching - started on neck side of injection, now has spread to back of head and ears. No fever. No pain just itching. Does the PT  have any chronic conditions? (i.e. diabetes, asthma, etc.) ---No Guidelines Guideline Title Affirmed Question Itching - Widespread [1] MODERATE-SEVERE widespread itching (i.e., interferes with sleep, normal activities or school) AND [2] not improved after 24 hours of itching Care Advice Disp. Time [name](Eastern Time) Disposition Final User 03/07/2017 7:25:40 PM Go to ED Now (or PCP triage) Yes [name], RN, [name] Referrals REFERRED TO PCP OFFICE Care Advice Given Per Guideline CARE ADVICE given per Itching, Widespread (Adult) guideline. GO TO ED NOW (OR PCP TRIAGE): Comments User: Adrian PrincePat, [name], RN Date/Time [name](Eastern Time): 03/07/2017 7:14:52 PM Allergies to PCN and Sulfa User: Adrian PrincePat, [name], RN Date/Time [name](Eastern Time): 03/07/2017 7:28:47 PM Call back to caller to let him know what doctor on call said. PLEASE NOTE: All timestamps contained within this report are represented as [place] Standard Time. CONFIDENTIALTY NOTICE: This fax transmission is intended only for the addressee. It contains information that is legally privileged, confidential or otherwise protected from use or disclosure. If you are not the intended recipient, you are strictly prohibited from reviewing, disclosing, copying using or disseminating any of this information or taking any action in reliance on or regarding this information. If you have received this fax in error, please notify us immediately by telephone so that we can arrange for its return to us. Phone: [phone], Toll-Free: [phone], Fax: [phone] Page: 2 of 2 Call Id: [id number] Paging DoctorName Phone DateTime Action Result/Outcome Notes [name], [name] [phone] 03/07/2017 7:23:15 PM Doctor Paged Paged On Call Back to Call Center [name], [name] 03/07/2017 7:25:26 PM Message Result Spoke with On Call - General Spoke with [name] --> patient to take Benadryl 25 mg and H2 blocker (Zantac, Axid, Pepcid, etc. as directed  on container) q 8 hours  for itching. Patient to call in am and go back to [name] for check.

## 2017-03-09 LAB — HSV 1/2 AB (IGM), IFA W/RFLX TITER
HSV 1 IgM Screen: NEGATIVE
HSV 2 IGM SCREEN: NEGATIVE

## 2017-03-09 LAB — HSV(HERPES SIMPLEX VRS) I + II AB-IGG
HAV 1 IGG,TYPE SPECIFIC AB: 41.2 index — ABNORMAL HIGH
HSV 2 IGG,TYPE SPECIFIC AB: <0.9 index

## 2017-03-09 LAB — HEPATITIS PANEL, ACUTE
HEP B C IGM: NONREACTIVE
HEP C AB: NONREACTIVE
Hep A IgM: NONREACTIVE
Hepatitis B Surface Ag: NONREACTIVE
SIGNAL TO CUT-OFF: 0.01 (ref <1.00)

## 2017-03-09 LAB — HIV ANTIBODY (ROUTINE TESTING W REFLEX): HIV 1&2 Ab, 4th Generation: NONREACTIVE

## 2017-03-09 LAB — RPR: RPR Ser Ql: NONREACTIVE

## 2017-03-23 ENCOUNTER — Telehealth: Payer: Self-pay

## 2017-03-23 NOTE — Telephone Encounter (Signed)
Pt left v/m; last annual exam on 03/04/17; pt had been taking sertraline 200 mg daily and pt wanted to try taking sertraline 100 mg daily. Pt thinks he needs to increase sertraline back to sertraline 200 mg daily or pt is willing to try different med. Pt [facility] [place].

## 2017-03-24 MED ORDER — SERTRALINE HCL 100 MG PO TABS
200.0000 mg | ORAL_TABLET | Freq: Every day | ORAL | 0 refills | Status: DC
Start: 2017-03-24 — End: 2017-07-13

## 2017-03-24 NOTE — Telephone Encounter (Signed)
Jakim notified as instructed by telephone.  [facility] [place]

## 2017-03-24 NOTE — Telephone Encounter (Signed)
Okay to increase back to 200 mg daily.. Provide 3 month refill.

## 2017-04-22 ENCOUNTER — Other Ambulatory Visit: Payer: Self-pay | Admitting: Family Medicine

## 2017-07-13 ENCOUNTER — Other Ambulatory Visit: Payer: Self-pay | Admitting: Family Medicine

## 2017-07-21 ENCOUNTER — Emergency Department (HOSPITAL_COMMUNITY): Payer: BLUE CROSS/BLUE SHIELD | Admitting: Anesthesiology

## 2017-07-21 ENCOUNTER — Observation Stay (HOSPITAL_COMMUNITY)
Admission: EM | Admit: 2017-07-21 | Discharge: 2017-07-22 | Disposition: A | Discharge status: Home / Self Care | Payer: BLUE CROSS/BLUE SHIELD | Attending: Surgery | Admitting: Surgery

## 2017-07-21 ENCOUNTER — Encounter (HOSPITAL_COMMUNITY): Payer: Self-pay

## 2017-07-21 ENCOUNTER — Other Ambulatory Visit: Payer: Self-pay

## 2017-07-21 ENCOUNTER — Encounter (HOSPITAL_COMMUNITY)
Admission: EM | Disposition: A | Discharge status: Home / Self Care | Payer: Self-pay | Source: Home / Self Care | Attending: Emergency Medicine

## 2017-07-21 DIAGNOSIS — K3580 Unspecified acute appendicitis: Secondary | ICD-10-CM | POA: Diagnosis not present

## 2017-07-21 DIAGNOSIS — R079 Chest pain, unspecified: Secondary | ICD-10-CM | POA: Diagnosis not present

## 2017-07-21 DIAGNOSIS — Z7951 Long term (current) use of inhaled steroids: Secondary | ICD-10-CM | POA: Insufficient documentation

## 2017-07-21 DIAGNOSIS — Z833 Family history of diabetes mellitus: Secondary | ICD-10-CM | POA: Insufficient documentation

## 2017-07-21 DIAGNOSIS — F329 Major depressive disorder, single episode, unspecified: Secondary | ICD-10-CM | POA: Insufficient documentation

## 2017-07-21 DIAGNOSIS — K3521 Acute appendicitis with generalized peritonitis, with abscess: Secondary | ICD-10-CM | POA: Diagnosis not present

## 2017-07-21 DIAGNOSIS — Z87891 Personal history of nicotine dependence: Secondary | ICD-10-CM | POA: Insufficient documentation

## 2017-07-21 DIAGNOSIS — Z880 Allergy status to penicillin: Secondary | ICD-10-CM | POA: Insufficient documentation

## 2017-07-21 DIAGNOSIS — Z882 Allergy status to sulfonamides status: Secondary | ICD-10-CM | POA: Diagnosis not present

## 2017-07-21 DIAGNOSIS — K3589 Other acute appendicitis without perforation or gangrene: Secondary | ICD-10-CM

## 2017-07-21 DIAGNOSIS — R0600 Dyspnea, unspecified: Secondary | ICD-10-CM | POA: Diagnosis not present

## 2017-07-21 DIAGNOSIS — R109 Unspecified abdominal pain: Secondary | ICD-10-CM | POA: Diagnosis not present

## 2017-07-21 DIAGNOSIS — K37 Unspecified appendicitis: Secondary | ICD-10-CM | POA: Diagnosis not present

## 2017-07-21 DIAGNOSIS — K353 Acute appendicitis with localized peritonitis, without perforation or gangrene: Secondary | ICD-10-CM | POA: Diagnosis not present

## 2017-07-21 DIAGNOSIS — Z809 Family history of malignant neoplasm, unspecified: Secondary | ICD-10-CM | POA: Diagnosis not present

## 2017-07-21 DIAGNOSIS — M7711 Lateral epicondylitis, right elbow: Secondary | ICD-10-CM | POA: Diagnosis not present

## 2017-07-21 DIAGNOSIS — Z79899 Other long term (current) drug therapy: Secondary | ICD-10-CM | POA: Insufficient documentation

## 2017-07-21 DIAGNOSIS — J309 Allergic rhinitis, unspecified: Secondary | ICD-10-CM | POA: Diagnosis not present

## 2017-07-21 DIAGNOSIS — I700 Atherosclerosis of aorta: Secondary | ICD-10-CM | POA: Diagnosis not present

## 2017-07-21 DIAGNOSIS — R1031 Right lower quadrant pain: Secondary | ICD-10-CM | POA: Diagnosis not present

## 2017-07-21 DIAGNOSIS — R1111 Vomiting without nausea: Secondary | ICD-10-CM | POA: Diagnosis not present

## 2017-07-21 HISTORY — PX: LAPAROSCOPIC APPENDECTOMY: SHX408

## 2017-07-21 SURGERY — APPENDECTOMY, LAPAROSCOPIC
Anesthesia: General | Site: Abdomen

## 2017-07-21 MED ORDER — MIDAZOLAM HCL 2 MG/2ML IJ SOLN
INTRAMUSCULAR | Status: AC
  Filled 2017-07-21: qty 2

## 2017-07-21 MED ORDER — ONDANSETRON 4 MG PO TBDP: 4.0000 mg | ORAL_TABLET | Freq: Four times a day (QID) | ORAL | Status: DC | PRN

## 2017-07-21 MED ORDER — SUCCINYLCHOLINE CHLORIDE 200 MG/10ML IV SOSY
PREFILLED_SYRINGE | INTRAVENOUS | Status: DC | PRN
  Administered 2017-07-21: 120 mg via INTRAVENOUS

## 2017-07-21 MED ORDER — BUPIVACAINE HCL (PF) 0.25 % IJ SOLN
INTRAMUSCULAR | Status: DC | PRN
  Administered 2017-07-21: 8 mL

## 2017-07-21 MED ORDER — SUGAMMADEX SODIUM 200 MG/2ML IV SOLN
INTRAVENOUS | Status: DC | PRN
  Administered 2017-07-21: 200 mg via INTRAVENOUS

## 2017-07-21 MED ORDER — ONDANSETRON HCL 4 MG/2ML IJ SOLN
INTRAMUSCULAR | Status: AC
  Filled 2017-07-21: qty 2

## 2017-07-21 MED ORDER — HYDROMORPHONE HCL 1 MG/ML IJ SOLN: 0.2500 mg | INTRAMUSCULAR | Status: DC | PRN

## 2017-07-21 MED ORDER — DIPHENHYDRAMINE HCL 50 MG/ML IJ SOLN
INTRAMUSCULAR | Status: DC | PRN
  Administered 2017-07-21: 12.5 mg via INTRAVENOUS

## 2017-07-21 MED ORDER — METRONIDAZOLE IN NACL 5-0.79 MG/ML-% IV SOLN
500.0000 mg | Freq: Three times a day (TID) | INTRAVENOUS | Status: DC
  Administered 2017-07-21 – 2017-07-22 (×3): 500 mg via INTRAVENOUS
  Filled 2017-07-21 (×3): qty 100

## 2017-07-21 MED ORDER — LIDOCAINE 2% (20 MG/ML) 5 ML SYRINGE
INTRAMUSCULAR | Status: DC | PRN
  Administered 2017-07-21: 100 mg via INTRAVENOUS

## 2017-07-21 MED ORDER — DIPHENHYDRAMINE HCL 25 MG PO CAPS: 25.0000 mg | ORAL_CAPSULE | Freq: Four times a day (QID) | ORAL | Status: DC | PRN

## 2017-07-21 MED ORDER — LIDOCAINE 2% (20 MG/ML) 5 ML SYRINGE
INTRAMUSCULAR | Status: AC
  Filled 2017-07-21: qty 5

## 2017-07-21 MED ORDER — OXYCODONE HCL 5 MG PO TABS
5.0000 mg | ORAL_TABLET | ORAL | Status: DC | PRN
  Administered 2017-07-22 (×2): 5 mg via ORAL
  Filled 2017-07-21 (×2): qty 1

## 2017-07-21 MED ORDER — KETOROLAC TROMETHAMINE 30 MG/ML IJ SOLN
INTRAMUSCULAR | Status: AC
  Filled 2017-07-21: qty 1

## 2017-07-21 MED ORDER — LACTATED RINGERS IV SOLN
INTRAVENOUS | Status: DC
  Administered 2017-07-21: 18:00:00 via INTRAVENOUS

## 2017-07-21 MED ORDER — KETOROLAC TROMETHAMINE 30 MG/ML IJ SOLN
INTRAMUSCULAR | Status: DC | PRN
  Administered 2017-07-21: 30 mg via INTRAVENOUS

## 2017-07-21 MED ORDER — ONDANSETRON HCL 4 MG/2ML IJ SOLN: 4.0000 mg | Freq: Four times a day (QID) | INTRAMUSCULAR | Status: DC | PRN

## 2017-07-21 MED ORDER — PROPOFOL 10 MG/ML IV BOLUS
INTRAVENOUS | Status: AC
  Filled 2017-07-21: qty 20

## 2017-07-21 MED ORDER — ONDANSETRON HCL 4 MG/2ML IJ SOLN
INTRAMUSCULAR | Status: DC | PRN
  Administered 2017-07-21: 4 mg via INTRAVENOUS

## 2017-07-21 MED ORDER — VANCOMYCIN HCL IN DEXTROSE 1-5 GM/200ML-% IV SOLN
INTRAVENOUS | Status: AC
  Filled 2017-07-21: qty 200

## 2017-07-21 MED ORDER — PROMETHAZINE HCL 25 MG/ML IJ SOLN: 6.2500 mg | INTRAMUSCULAR | Status: DC | PRN

## 2017-07-21 MED ORDER — CIPROFLOXACIN IN D5W 400 MG/200ML IV SOLN
400.0000 mg | Freq: Two times a day (BID) | INTRAVENOUS | Status: DC
  Administered 2017-07-21 – 2017-07-22 (×2): 400 mg via INTRAVENOUS
  Filled 2017-07-21 (×2): qty 200

## 2017-07-21 MED ORDER — METHOCARBAMOL 500 MG PO TABS: 500.0000 mg | ORAL_TABLET | Freq: Four times a day (QID) | ORAL | Status: DC | PRN

## 2017-07-21 MED ORDER — FENTANYL CITRATE (PF) 250 MCG/5ML IJ SOLN
INTRAMUSCULAR | Status: DC | PRN
  Administered 2017-07-21: 100 ug via INTRAVENOUS

## 2017-07-21 MED ORDER — SODIUM CHLORIDE 0.9 % IV BOLUS (SEPSIS)
1000.0000 mL | Freq: Once | INTRAVENOUS | Status: AC
  Administered 2017-07-21: 1000 mL via INTRAVENOUS

## 2017-07-21 MED ORDER — SODIUM CHLORIDE 0.9 % IV SOLN
INTRAVENOUS | Status: DC
  Administered 2017-07-21: 21:00:00 via INTRAVENOUS

## 2017-07-21 MED ORDER — TRAMADOL HCL 50 MG PO TABS: 50.0000 mg | ORAL_TABLET | Freq: Four times a day (QID) | ORAL | Status: DC | PRN

## 2017-07-21 MED ORDER — SUGAMMADEX SODIUM 200 MG/2ML IV SOLN
INTRAVENOUS | Status: AC
  Filled 2017-07-21: qty 2

## 2017-07-21 MED ORDER — KETOROLAC TROMETHAMINE 30 MG/ML IJ SOLN
30.0000 mg | Freq: Four times a day (QID) | INTRAMUSCULAR | Status: DC
  Administered 2017-07-22 (×2): 30 mg via INTRAVENOUS
  Filled 2017-07-21 (×2): qty 1

## 2017-07-21 MED ORDER — DIPHENHYDRAMINE HCL 50 MG/ML IJ SOLN: 25.0000 mg | Freq: Four times a day (QID) | INTRAMUSCULAR | Status: DC | PRN

## 2017-07-21 MED ORDER — PHENYLEPHRINE 40 MCG/ML (10ML) SYRINGE FOR IV PUSH (FOR BLOOD PRESSURE SUPPORT)
PREFILLED_SYRINGE | INTRAVENOUS | Status: AC
  Filled 2017-07-21: qty 10

## 2017-07-21 MED ORDER — PROPOFOL 10 MG/ML IV BOLUS
INTRAVENOUS | Status: DC | PRN
  Administered 2017-07-21: 200 mg via INTRAVENOUS
  Administered 2017-07-21: 50 mg via INTRAVENOUS

## 2017-07-21 MED ORDER — ONDANSETRON HCL 4 MG/2ML IJ SOLN
4.0000 mg | Freq: Once | INTRAMUSCULAR | Status: AC
  Administered 2017-07-21: 4 mg via INTRAVENOUS
  Filled 2017-07-21: qty 2

## 2017-07-21 MED ORDER — ENOXAPARIN SODIUM 40 MG/0.4ML ~~LOC~~ SOLN: 40.0000 mg | SUBCUTANEOUS | Status: DC

## 2017-07-21 MED ORDER — FENTANYL CITRATE (PF) 250 MCG/5ML IJ SOLN
INTRAMUSCULAR | Status: AC
  Filled 2017-07-21: qty 5

## 2017-07-21 MED ORDER — BUPIVACAINE HCL (PF) 0.25 % IJ SOLN
INTRAMUSCULAR | Status: AC
  Filled 2017-07-21: qty 30

## 2017-07-21 MED ORDER — SCOPOLAMINE 1 MG/3DAYS TD PT72
MEDICATED_PATCH | TRANSDERMAL | Status: AC
  Filled 2017-07-21: qty 1

## 2017-07-21 MED ORDER — ACETAMINOPHEN 325 MG PO TABS: 650.0000 mg | ORAL_TABLET | Freq: Four times a day (QID) | ORAL | Status: DC | PRN

## 2017-07-21 MED ORDER — EPHEDRINE 5 MG/ML INJ
INTRAVENOUS | Status: AC
  Filled 2017-07-21: qty 10

## 2017-07-21 MED ORDER — ROCURONIUM BROMIDE 10 MG/ML (PF) SYRINGE
PREFILLED_SYRINGE | INTRAVENOUS | Status: DC | PRN
  Administered 2017-07-21: 40 mg via INTRAVENOUS

## 2017-07-21 MED ORDER — ACETAMINOPHEN 650 MG RE SUPP: 650.0000 mg | Freq: Four times a day (QID) | RECTAL | Status: DC | PRN

## 2017-07-21 MED ORDER — MORPHINE SULFATE (PF) 2 MG/ML IV SOLN
2.0000 mg | INTRAVENOUS | Status: DC | PRN
  Administered 2017-07-21: 2 mg via INTRAVENOUS
  Filled 2017-07-21: qty 1

## 2017-07-21 MED ORDER — MIDAZOLAM HCL 5 MG/5ML IJ SOLN
INTRAMUSCULAR | Status: DC | PRN
  Administered 2017-07-21: 2 mg via INTRAVENOUS

## 2017-07-21 MED ORDER — 0.9 % SODIUM CHLORIDE (POUR BTL) OPTIME
TOPICAL | Status: DC | PRN
  Administered 2017-07-21: 1000 mL

## 2017-07-21 MED ORDER — DEXAMETHASONE SODIUM PHOSPHATE 10 MG/ML IJ SOLN
INTRAMUSCULAR | Status: DC | PRN
  Administered 2017-07-21: 6 mg via INTRAVENOUS

## 2017-07-21 MED ORDER — HYDROMORPHONE HCL 1 MG/ML IJ SOLN
1.0000 mg | Freq: Once | INTRAMUSCULAR | Status: AC
  Administered 2017-07-21: 1 mg via INTRAVENOUS
  Filled 2017-07-21: qty 1

## 2017-07-21 MED ORDER — SERTRALINE HCL 100 MG PO TABS
200.0000 mg | ORAL_TABLET | Freq: Every day | ORAL | Status: DC
  Administered 2017-07-22: 200 mg via ORAL
  Filled 2017-07-21: qty 2

## 2017-07-21 MED ORDER — DIPHENHYDRAMINE HCL 50 MG/ML IJ SOLN
INTRAMUSCULAR | Status: AC
  Filled 2017-07-21: qty 1

## 2017-07-21 MED ORDER — SODIUM CHLORIDE 0.9 % IR SOLN
Status: DC | PRN
  Administered 2017-07-21: 1000 mL

## 2017-07-21 MED ORDER — SCOPOLAMINE 1 MG/3DAYS TD PT72
MEDICATED_PATCH | TRANSDERMAL | Status: DC | PRN
  Administered 2017-07-21: 1 via TRANSDERMAL

## 2017-07-21 SURGICAL SUPPLY — 46 items
APL SKNCLS STERI-STRIP NONHPOA (GAUZE/BANDAGES/DRESSINGS) ×1
APPLIER CLIP ROT 10 11.4 M/L (STAPLE)
APR CLP MED LRG 11.4X10 (STAPLE)
BAG SPEC RTRVL LRG 6X4 10 (ENDOMECHANICALS) ×1
BENZOIN TINCTURE PRP APPL 2/3 (GAUZE/BANDAGES/DRESSINGS) ×2 IMPLANT
BLADE CLIPPER SURG (BLADE) ×1 IMPLANT
CANISTER SUCT 3000ML PPV (MISCELLANEOUS) ×2 IMPLANT
CHLORAPREP W/TINT 26ML (MISCELLANEOUS) ×2 IMPLANT
CLIP APPLIE ROT 10 11.4 M/L (STAPLE) IMPLANT
COVER SURGICAL LIGHT HANDLE (MISCELLANEOUS) ×2 IMPLANT
CUTTER FLEX LINEAR 45M (STAPLE) ×2 IMPLANT
DRSG TEGADERM 2-3/8X2-3/4 SM (GAUZE/BANDAGES/DRESSINGS) ×4 IMPLANT
DRSG TEGADERM 4X4.75 (GAUZE/BANDAGES/DRESSINGS) ×2 IMPLANT
ELECT REM PT RETURN 9FT ADLT (ELECTROSURGICAL) ×2
ELECTRODE REM PT RTRN 9FT ADLT (ELECTROSURGICAL) ×1 IMPLANT
ENDOLOOP SUT PDS II  0 18 (SUTURE)
ENDOLOOP SUT PDS II 0 18 (SUTURE) IMPLANT
FILTER SMOKE EVAC LAPAROSHD (FILTER) ×2 IMPLANT
GAUZE SPONGE 2X2 8PLY STRL LF (GAUZE/BANDAGES/DRESSINGS) ×1 IMPLANT
GLOVE BIO SURGEON STRL SZ7 (GLOVE) ×2 IMPLANT
GLOVE BIOGEL PI IND STRL 7.5 (GLOVE) ×1 IMPLANT
GLOVE BIOGEL PI INDICATOR 7.5 (GLOVE) ×1
GOWN STRL REUS W/ TWL LRG LVL3 (GOWN DISPOSABLE) ×3 IMPLANT
GOWN STRL REUS W/TWL LRG LVL3 (GOWN DISPOSABLE) ×6
KIT BASIN OR (CUSTOM PROCEDURE TRAY) ×2 IMPLANT
KIT ROOM TURNOVER OR (KITS) ×2 IMPLANT
NS IRRIG 1000ML POUR BTL (IV SOLUTION) ×2 IMPLANT
PAD ARMBOARD 7.5X6 YLW CONV (MISCELLANEOUS) ×4 IMPLANT
POUCH SPECIMEN RETRIEVAL 10MM (ENDOMECHANICALS) ×2 IMPLANT
RELOAD STAPLE 45 3.5 BLU ETS (ENDOMECHANICALS) ×1 IMPLANT
RELOAD STAPLE TA45 3.5 REG BLU (ENDOMECHANICALS) ×2 IMPLANT
SCISSORS ENDO CVD 5DCS (MISCELLANEOUS) IMPLANT
SET IRRIG TUBING LAPAROSCOPIC (IRRIGATION / IRRIGATOR) ×2 IMPLANT
SHEARS HARMONIC ACE PLUS 36CM (ENDOMECHANICALS) ×2 IMPLANT
SLEEVE ENDOPATH XCEL 5M (ENDOMECHANICALS) ×2 IMPLANT
SPECIMEN JAR SMALL (MISCELLANEOUS) ×2 IMPLANT
SPONGE GAUZE 2X2 STER 10/PKG (GAUZE/BANDAGES/DRESSINGS) ×1
STRIP CLOSURE SKIN 1/2X4 (GAUZE/BANDAGES/DRESSINGS) ×3 IMPLANT
SUT MNCRL AB 4-0 PS2 18 (SUTURE) ×2 IMPLANT
TOWEL OR 17X24 6PK STRL BLUE (TOWEL DISPOSABLE) ×2 IMPLANT
TOWEL OR 17X26 10 PK STRL BLUE (TOWEL DISPOSABLE) ×2 IMPLANT
TRAY LAPAROSCOPIC MC (CUSTOM PROCEDURE TRAY) ×2 IMPLANT
TROCAR XCEL BLUNT TIP 100MML (ENDOMECHANICALS) ×2 IMPLANT
TROCAR XCEL NON-BLD 5MMX100MML (ENDOMECHANICALS) ×2 IMPLANT
TUBING INSUFFLATION (TUBING) ×2 IMPLANT
WATER STERILE IRR 1000ML POUR (IV SOLUTION) ×2 IMPLANT

## 2017-07-21 NOTE — Transfer of Care (Signed)
Immediate Anesthesia Transfer of Care Note  Patient: Christopher Hatfield  Procedure(s) Performed: APPENDECTOMY LAPAROSCOPIC (N/A Abdomen)  Patient Location: PACU  Anesthesia Type:General  Level of Consciousness: awake, alert , oriented and patient cooperative  Airway & Oxygen Therapy: Patient Spontanous Breathing and Patient connected to nasal cannula oxygen  Post-op Assessment: Report given to RN, Post -op Vital signs reviewed and stable and Patient moving all extremities  Post vital signs: Reviewed and stable  Last Vitals:  Vitals:   07/21/17 1730 07/21/17 1942  BP: 134/76 125/75  Pulse: 82 95  Resp:  (!) 25  Temp:  36.7 C  SpO2: 100% 100%    Last Pain:  Vitals:   07/21/17 1942  PainSc: (P) Asleep         Complications: No apparent anesthesia complications

## 2017-07-21 NOTE — H&P (Signed)
Christopher Hatfield is an 37 y.o. male.   Chief Complaint: RLQ abdominal pain HPI: 37 yo male in good health presents with acute onset of periumbilical pain now migrated down to the RLQ.  This is associated with nausea and vomiting.  Onset began around 0830 this morning.  [place] showed acute appendicitis without perforation.  WBC 15.6.  [facility].  He lives in [place].  Past Medical History:  Diagnosis Date  . Carpal tunnel syndrome of right wrist 08/2013    Past Surgical History:  Procedure Laterality Date  . CARPAL TUNNEL RELEASE Left 07/27/2013   Procedure: LEFT CARPAL TUNNEL RELEASE;  Surgeon: Tami RibasKevin R Kuzma, MD;  Location: [facility];  Service: Orthopedics;  Laterality: Left;  . CARPAL TUNNEL RELEASE Right 09/07/2013   Procedure: RIGHT CARPAL TUNNEL RELEASE;  Surgeon: Tami RibasKevin R Kuzma, MD;  Location: [facility];  Service: Orthopedics;  Laterality: Right;  . TONSILLECTOMY    . VASECTOMY    . WISDOM TOOTH EXTRACTION      Family History  Problem Relation Age of Onset  . Depression Mother   . Deep vein thrombosis Father   . Heart attack Maternal Grandmother   . Diabetes Paternal Grandmother   . Cancer Paternal Grandfather        PROSTATE   Social History:  reports that he has quit smoking. He has quit using smokeless tobacco. He reports that he does not drink alcohol or use drugs.  Allergies:  Allergies  Allergen Reactions  . Penicillins Swelling  . Sulfonamide Derivatives Swelling    Prior to Admission medications   Medication Sig Start Date End Date Taking? Authorizing Provider  Aspirin-Salicylamide-Caffeine (BC HEADACHE) 325-95-16 MG TABS Take 2 each by mouth daily.    [provider]  cetirizine (ZYRTEC) 10 MG tablet Take 10 mg by mouth daily.    [provider]  cyclobenzaprine (FLEXERIL) 10 MG tablet Take 1 tablet (10 mg total) by mouth at bedtime as needed for  muscle spasms. 02/05/16   Bedsole, Amy E, MD  diclofenac (VOLTAREN) 75 MG EC tablet Take 1 tablet (75 mg total) by mouth 2 (two) times daily. Patient not taking: Reported on 03/07/2017 02/11/17   Excell SeltzerBedsole, Amy E, MD  fluticasone (FLONASE) 50 MCG/ACT nasal spray PLACE 2 SPRAYS INTO BOTH NOSTRILS DAILY. 04/22/17   Bedsole, Amy E, MD  sertraline (ZOLOFT) 100 MG tablet TAKE 2 TABLETS BY MOUTH EVERY DAY 07/13/17   Excell SeltzerBedsole, Amy E, MD    See [place]  Review of Systems  Constitutional: Negative for weight loss.  HENT: Negative for ear discharge, ear pain, hearing loss and tinnitus.   Eyes: Negative for blurred vision, double vision, photophobia and pain.  Respiratory: Negative for cough, sputum production and shortness of breath.   Cardiovascular: Negative for chest pain.  Gastrointestinal: Positive for abdominal pain, nausea and vomiting.  Genitourinary: Negative for dysuria, flank pain, frequency and urgency.  Musculoskeletal: Negative for back pain, falls, joint pain, myalgias and neck pain.  Neurological: Negative for dizziness, tingling, sensory change, focal weakness, loss of consciousness and headaches.  Endo/Heme/Allergies: Does not bruise/bleed easily.  Psychiatric/Behavioral: Negative for depression, memory loss and substance abuse. The patient is not nervous/anxious.     Blood pressure 127/88, pulse 74, temperature 98.7 F (37.1 C), resp. rate 18, weight 96.6 kg (213 lb), SpO2 100 %. Physical Exam  WDWN -  uncomfortable appearing Eyes:  Pupils equal, round; sclera anicteric HENT:  Oral mucosa moist; good dentition  Neck:  No masses palpated, no thyromegaly Lungs:  CTA bilaterally; normal respiratory effort CV:  Regular rate and rhythm; no murmurs; extremities well-perfused with no edema Abd:  +bowel sounds, soft, very tender to palpation in RLQ, no palpable organomegaly; no palpable hernias Skin:  Warm, dry; no sign of jaundice Psychiatric - alert and oriented x  4; calm mood and affect  Assessment/Plan Acute appendicitis  Laparoscopic appendectomy.  The surgical procedure has been discussed with the patient.  Potential risks, benefits, alternative treatments, and expected outcomes have been explained.  All of the patient's questions at this time have been answered.  The likelihood of reaching the patient's treatment goal is good.  The patient understand the proposed surgical procedure and wishes to proceed.   Wynona Luna, MD 07/21/2017, 5:45 PM

## 2017-07-21 NOTE — ED Triage Notes (Signed)
[facility] for [facility]icit[facility].  The patient started having abdominal pain that started around his belly button and moved over to his right side today.  He has received IV Flagyl and Cipro and 2 doses of dilaudid last being at 1400.

## 2017-07-21 NOTE — Anesthesia Preprocedure Evaluation (Addendum)
Anesthesia Evaluation  Patient identified by MRN, date of birth, ID band Patient awake    Reviewed: Allergy & Precautions, NPO status , Patient's Chart, lab work & pertinent test results  History of Anesthesia Complications Negative for: history of anesthetic complications  Airway Mallampati: II  TM Distance: >3 FB Neck ROM: Full    Dental no notable dental hx. (+) Dental Advisory Given   Pulmonary former smoker,    Pulmonary exam normal        Cardiovascular negative cardio ROS Normal cardiovascular exam     Neuro/Psych PSYCHIATRIC DISORDERS Depression negative neurological ROS  negative psych ROS   GI/Hepatic Neg liver ROS,   Endo/Other  negative endocrine ROS  Renal/GU negative Renal ROS  negative genitourinary   Musculoskeletal negative musculoskeletal ROS (+)   Abdominal   Peds negative pediatric ROS (+)  Hematology negative hematology ROS (+)   Anesthesia Other Findings   Reproductive/Obstetrics negative OB ROS                            Anesthesia Physical Anesthesia Plan  ASA: II and emergent  Anesthesia Plan: General   Post-op Pain Management:    Induction: Intravenous  PONV Risk Score and Plan: 3 and Ondansetron, Dexamethasone and Diphenhydramine  Airway Management Planned: Oral ETT  Additional Equipment:   Intra-op Plan:   Post-operative Plan: Extubation in OR  Informed Consent: I have reviewed the patients History and Physical, chart, labs and discussed the procedure including the risks, benefits and alternatives for the proposed anesthesia with the patient or authorized representative who has indicated his/her understanding and acceptance.   Dental advisory given  Plan Discussed with: CRNA, Anesthesiologist and Surgeon  Anesthesia Plan Comments:        Anesthesia Quick Evaluation

## 2017-07-21 NOTE — Anesthesia Procedure Notes (Signed)
Procedure Name: Intubation Date/Time: 07/21/2017 6:43 PM Performed by: Myna Bright, CRNA Pre-anesthesia Checklist: Emergency Drugs available, Patient identified, Patient being monitored and Suction available Patient Re-evaluated:Patient Re-evaluated prior to induction Oxygen Delivery Method: Circle system utilized Preoxygenation: Pre-oxygenation with 100% oxygen Induction Type: IV induction, Rapid sequence and Cricoid Pressure applied Laryngoscope Size: Mac and 4 Grade View: Grade II Tube type: Oral Tube size: 7.5 mm Number of attempts: 1 Airway Equipment and Method: Stylet Placement Confirmation: ETT inserted through vocal cords under direct vision,  positive ETCO2 and breath sounds checked- equal and bilateral Secured at: 22 cm Tube secured with: Tape Dental Injury: Teeth and Oropharynx as per pre-operative assessment

## 2017-07-21 NOTE — Anesthesia Postprocedure Evaluation (Signed)
Anesthesia Post Note  Patient: Doristine Sectionimothy J Wittmann  Procedure(s) Performed: APPENDECTOMY LAPAROSCOPIC (N/A Abdomen)     Patient location during evaluation: PACU Anesthesia Type: General Level of consciousness: sedated Pain management: pain level controlled Vital Signs Assessment: post-procedure vital signs reviewed and stable Respiratory status: spontaneous breathing and respiratory function stable Cardiovascular status: stable Postop Assessment: no apparent nausea or vomiting Anesthetic complications: no    Last Vitals:  Vitals:   07/21/17 2023 07/21/17 2045  BP: 110/60 118/71  Pulse: 77 72  Resp: 15 16  Temp: 36.7 C 36.9 C  SpO2: 95% 97%    Last Pain:  Vitals:   07/21/17 2045  TempSrc: Oral  PainSc: 4                  Oumou Smead DANIEL

## 2017-07-21 NOTE — ED Provider Notes (Signed)
[facility] EMERGENCY DEPARTMENT Provider Note   CSN: [id number] Arrival date & time: 07/21/17  1613     History   Chief Complaint Chief Complaint  Patient presents with  . confirmed appendicitis    HPI Christopher Hatfield is a 37 y.o. male with history of depression here from [facility] emergency room for confirmed appendicitis. He developed nausea, vomiting, periumbilical abdominal pain that radiated to the right lower quadrant into his testicle around 9 AM today while he was driving. Pain is 10/10. Denies fevers or chills. Denies diarrhea or constipation. No previous abdominal surgeries. Outside emergency room paperwork shows patient has leukocytosis at 15.6 and confirmed appendicitis on CT abdomen/pelvis without rupture or abscess. Last oral intake around 10 AM today.  HPI  Past Medical History:  Diagnosis Date  . Carpal tunnel syndrome of right wrist 08/2013    Patient Active Problem List   Diagnosis Date Noted  . Obesity (BMI 30.0-34.9) 03/04/2017  . Lateral epicondylitis of right elbow 02/11/2017  . Right rotator cuff tendinitis 02/11/2017  . Chronic radicular low back pain 07/29/2014  . Allergic rhinitis 01/06/2010  . Depression, major, recurrent, moderate (HCC) 09/25/2007    Past Surgical History:  Procedure Laterality Date  . CARPAL TUNNEL RELEASE Left 07/27/2013   Procedure: LEFT CARPAL TUNNEL RELEASE;  Surgeon: Tami Ribas, MD;  Location: [facility];  Service: Orthopedics;  Laterality: Left;  . CARPAL TUNNEL RELEASE Right 09/07/2013   Procedure: RIGHT CARPAL TUNNEL RELEASE;  Surgeon: Tami Ribas, MD;  Location: [facility];  Service: Orthopedics;  Laterality: Right;  . TONSILLECTOMY    . VASECTOMY    . WISDOM TOOTH EXTRACTION         Home Medications    Prior to Admission medications   Medication Sig Start Date End Date Taking? Authorizing Provider  Aspirin-Salicylamide-Caffeine (BC HEADACHE) 325-95-16 MG  TABS Take 2 each by mouth daily.    [provider]  cetirizine (ZYRTEC) 10 MG tablet Take 10 mg by mouth daily.    [provider]  cyclobenzaprine (FLEXERIL) 10 MG tablet Take 1 tablet (10 mg total) by mouth at bedtime as needed for muscle spasms. 02/05/16   Bedsole, Amy E, MD  diclofenac (VOLTAREN) 75 MG EC tablet Take 1 tablet (75 mg total) by mouth 2 (two) times daily. Patient not taking: Reported on 03/07/2017 02/11/17   Excell Seltzer, MD  fluticasone (FLONASE) 50 MCG/ACT nasal spray PLACE 2 SPRAYS INTO BOTH NOSTRILS DAILY. 04/22/17   Bedsole, Amy E, MD  sertraline (ZOLOFT) 100 MG tablet TAKE 2 TABLETS BY MOUTH EVERY DAY 07/13/17   Excell Seltzer, MD    Family History Family History  Problem Relation Age of Onset  . Depression Mother   . Deep vein thrombosis Father   . Heart attack Maternal Grandmother   . Diabetes Paternal Grandmother   . Cancer Paternal Grandfather        PROSTATE    Social History Social History   Tobacco Use  . Smoking status: Former Games developer  . Smokeless tobacco: Former Neurosurgeon  . Tobacco comment: quit smoking 6 years ago  Substance Use Topics  . Alcohol use: No    Alcohol/week: 0.0 oz  . Drug use: No     Allergies   Penicillins and Sulfonamide derivatives   Review of Systems Review of Systems  Gastrointestinal: Positive for abdominal pain, nausea and vomiting.  All other systems reviewed and are negative.  Physical Exam Updated Vital Signs BP 127/88   Pulse 74   Temp 98.7 F (37.1 C)   Resp 18   Wt 96.6 kg (213 lb)   SpO2 100%   BMI 32.63 kg/m   Physical Exam  Constitutional: He is oriented to person, place, and time. He appears well-developed and well-nourished. No distress.  Looks uncomfortable. Moving around in bed holding his abdomen.  HENT:  Head: Normocephalic and atraumatic.  Nose: Nose normal.  Mouth/Throat: No oropharyngeal exudate.  Dry mucous membranes.  Eyes: Conjunctivae and EOM are normal. Pupils  are equal, round, and reactive to light.  Neck: Normal range of motion.  Cardiovascular: Normal rate, regular rhythm, normal heart sounds and intact distal pulses.  No murmur heard. 2+ DP and radial pulses bilaterally. No LE edema.   Pulmonary/Chest: Effort normal and breath sounds normal.  Abdominal: Soft. Bowel sounds are normal. There is tenderness.  Diffuse periumbilical, right lower quadrant and suprapubic tenderness with mild guarding. No rebound rigidity or CVA tenderness.  Musculoskeletal: Normal range of motion. He exhibits no deformity.  Neurological: He is alert and oriented to person, place, and time.  Skin: Skin is warm and dry. Capillary refill takes less than 2 seconds.  Psychiatric: He has a normal mood and affect. His behavior is normal. Judgment and thought content normal.  Nursing note and vitals reviewed.    ED Treatments / Results  Labs (all labs ordered are listed, but only abnormal results are displayed) Labs Reviewed - No data to display  EKG  EKG Interpretation None       Radiology No results found.  Procedures Procedures (including critical care time)  Medications Ordered in ED Medications  sodium chloride 0.9 % bolus 1,000 mL (1,000 mLs Intravenous New Bag/Given 07/21/17 1710)  HYDROmorphone (DILAUDID) injection 1 mg (1 mg Intravenous Given 07/21/17 1709)  ondansetron (ZOFRAN) injection 4 mg (4 mg Intravenous Given 07/21/17 1709)     Initial Impression / Assessment and Plan / ED Course  I have reviewed the triage vital signs and the nursing notes.  Pertinent labs & imaging results that were available during my care of the patient were reviewed by me and considered in my medical decision making (see chart for details).  Clinical Course as of Jul 21 1714  Thu Jul 21, 2017  1710 Spoke to Dr Corliss Skainssuei who will see pt in ED.   [CG]    Clinical Course User Index [CG] Liberty HandyGibbons, Yitzel Shasteen J, PA-C   Patient has [facility]  significant for leukocytosis at 15.6 and appendicitis on CT scan, without rupture or abscess. He has received Cipro/Flagyl at 1429 before transfer. We'll give analgesia and antiemetics and consult general surgery.  Final Clinical Impressions(s) / ED Diagnoses   Final diagnoses:  Other acute appendicitis    ED Discharge Orders    None       Liberty HandyGibbons, Jahdai Padovano J, PA-C 07/21/17 1716    Arby BarrettePfeiffer, Marcy, MD 07/26/17 1734

## 2017-07-21 NOTE — Op Note (Signed)
Appendectomy, Lap, Procedure Note  Indications: The patient presented with a one-day history of right-sided abdominal pain. A CT revealed findings consistent with acute appendicitis.  [facility].  Pre-operative Diagnosis: Acute appendicitis with generalized peritonitis  Post-operative Diagnosis: Same  Surgeon: Christopher LunaMatthew K Borghild Thaker   Assistants: none  Anesthesia: General endotracheal anesthesia  ASA Class: 1E  Procedure Details  The patient was seen again in the Holding Room. The risks, benefits, complications, treatment options, and expected outcomes were discussed with the patient and/or family. The possibilities of reaction to medication, perforation of viscus, bleeding, recurrent infection, finding a normal appendix, the need for additional procedures, failure to diagnose a condition, and creating a complication requiring transfusion or operation were discussed. There was concurrence with the proposed plan and informed consent was obtained. The site of surgery was properly noted. The patient was taken to Operating Room, identified as Doristine Sectionimothy J Urda and the procedure verified as Appendectomy. A Time Out was held and the above information confirmed.  The patient was placed in the supine position and general anesthesia was induced.  The abdomen was prepped and draped in a sterile fashion. A one centimeter supraumbilical incision was made.  Dissection was carried down to the fascia bluntly.  The fascia was incised vertically.  We entered the peritoneal cavity bluntly.  A pursestring suture was passed around the incision with a 0 Vicryl.  The Hasson cannula was introduced into the abdomen and the tails of the suture were used to hold the Hasson in place.   The pneumoperitoneum was then established maintaining a maximum pressure of 15 mmHg.  Additional 5 mm cannulas then placed in the left lower quadrant of the abdomen and the right upper quadrant under  direct visualization. A careful evaluation of the entire abdomen was carried out. The patient was placed in Trendelenburg and left lateral decubitus position.  The scope was moved to the right upper quadrant port site.   The appendix was easily identified in the lateral part of the pelvis, adherent to the lateral wall.  There was obvious inflammation, but no sign of perforation.  The appendix was carefully dissected. The appendix was was skeletonized with the harmonic scalpel.   The appendix was divided at its base using an endo-GIA stapler. No appendiceal stump was left in place. There was no evidence of bleeding, leakage, or complication after division of the appendix. Irrigation was also performed and irrigate suctioned from the abdomen as well.  The umbilical port site was closed with the purse string suture. There was no residual palpable fascial defect.  The trocar site skin wounds were closed with 4-0 Monocryl.  Instrument, sponge, and needle counts were correct at the conclusion of the case.   Findings: The appendix was found to be inflamed. There were not signs of necrosis.  There was not perforation. There was not abscess formation.  Estimated Blood Loss:  Minimal         Drains: none         Specimens: appendix         Complications:  None; patient tolerated the procedure well.         Disposition: PACU - hemodynamically stable.         Condition: stable  Wilmon ArmsMatthew K. Corliss Skainssuei, MD, [facility]  General/ Trauma Surgery  07/21/2017 7:31 PM

## 2017-07-22 ENCOUNTER — Encounter (HOSPITAL_COMMUNITY): Payer: Self-pay | Admitting: Surgery

## 2017-07-22 MED ORDER — IBUPROFEN 200 MG PO TABS: ORAL_TABLET | ORAL | 0 refills | Status: DC

## 2017-07-22 MED ORDER — ACETAMINOPHEN 325 MG PO TABS: ORAL_TABLET | ORAL | Status: DC

## 2017-07-22 MED ORDER — OXYCODONE HCL 5 MG PO TABS: 5.0000 mg | ORAL_TABLET | ORAL | 0 refills | Status: DC | PRN

## 2017-07-22 MED ORDER — IBUPROFEN 200 MG PO TABS: 600.0000 mg | ORAL_TABLET | Freq: Four times a day (QID) | ORAL | Status: DC | PRN

## 2017-07-22 NOTE — Discharge Summary (Signed)
Physician Discharge Summary  Patient ID: Christopher Hatfield MRN: [id number] DOB/AGE: 09-25-1980 37 y.o.  Admit date: 07/21/2017 Discharge date: 07/22/2017  Admission Diagnoses:  Acute appendicitis with generalized peritonitis  Discharge Diagnoses:  Acute appendicitis with generalized peritonitis  Active Problems:   Acute appendicitis with localized peritonitis   PROCEDURES: Laparoscopic appendectomy 07/21/17 Dr. Candice CampMatthew Tsuei  Hospital Course:   37 yo male in good health presents with acute onset of periumbilical pain now migrated down to the RLQ.  This is associated with nausea and vomiting.  Onset began around 0830 this morning.  [place] [place] scan showed acute appendicitis without perforation.  WBC 15.6.  [facility].  [place] in [place].  Patient was seen in the emergency department and taken the operating room later that evening by Dr. Manus RuddMatthew Tsuei.  He tolerated the procedure well and was mobilized.  He was tolerating a diet and tolerating p.o. pain meds and was discharged the following day.  Condition on discharge: Improving   CBC    Component Value Date/Time   WBC 4.4 (L) 03/14/2013 0754   RBC 4.80 03/14/2013 0754   HGB 13.9 09/07/2013 1253   HCT 41.7 03/14/2013 0754   PLT 261.0 03/14/2013 0754   MCV 86.7 03/14/2013 0754   MCHC 33.8 03/14/2013 0754   RDW 13.3 03/14/2013 0754   LYMPHSABS 1.8 03/14/2013 0754   MONOABS 0.4 03/14/2013 0754   EOSABS 0.4 03/14/2013 0754   BASOSABS 0.0 03/14/2013 0754   CMP Latest Ref Rng & Units 03/04/2017 02/24/2016 03/14/2013  Glucose 65 - 99 mg/dL 76 81 90  BUN 7 - 25 mg/dL 24 13 11   Creatinine 0.60 - 1.35 mg/dL 4.091.05 8.110.95 0.9  Sodium 914135 - 146 mmol/L 136 140 138  Potassium 3.5 - 5.3 mmol/L 4.7 4.5 4.4  Chloride 98 - 110 mmol/L 102 106 105  CO2 20 - 32 mmol/L 25 30 29   Calcium 8.6 - 10.3 mg/dL 9.8 8.8 9.3  Total Protein 6.1 - 8.1 g/dL 7.3 6.6 6.8  Total Bilirubin 0.2 - 1.2 mg/dL  0.6 0.3 0.7  Alkaline Phos 39 - 117 U/L - 52 46  AST 10 - 40 U/L 20 19 17   ALT 9 - 46 U/L 21 25 16    I have personally reviewed the patients medication history on the [place] controlled substance database.   Disposition: 01-Home or Self Care   Allergies as of 07/22/2017      Reactions   Penicillins Swelling   Has patient had a PCN reaction causing immediate rash, facial/tongue/throat swelling, SOB or lightheadedness with hypotension: no [id number]} Has patient had a PCN reaction causing severe rash involving mucus membranes or skin necrosis: No Has patient had a PCN reaction that required hospitalization: No Has patient had a PCN reaction occurring within the last 10 years: No If all of the above answers are "NO", then may proceed with Cephalosporin use.   Sulfonamide Derivatives Swelling      Medication List    TAKE these medications   acetaminophen 325 MG tablet Commonly known as:  TYLENOL You can take 2 tablets every 4 hours as needed for pain.  You can alternate with Ibuprofen, or Oxycodone.  Do not exceed 4000 mg per day.   BC HEADACHE 325-95-16 MG Tabs Generic drug:  Aspirin-Salicylamide-Caffeine Take 2 each by mouth as needed.   cyclobenzaprine 10 MG tablet Commonly known as:  FLEXERIL Take 1 tablet (10 mg  total) by mouth at bedtime as needed for muscle spasms.   fluticasone 50 MCG/ACT nasal spray Commonly known as:  FLONASE PLACE 2 SPRAYS INTO BOTH NOSTRILS DAILY.   ibuprofen 200 MG tablet Commonly known as:  ADVIL,MOTRIN You can take 2-3 tablets every 6 hours as needed for pain.  You can alternate with Tylenol(acetaminophen) or the oxycodone.  I would not exceed this amount.  Ibuprofen can hurt your kidneys, and cause an ulcer.    You can buy this over the counter at any drug store. What changed:    how much to take  how to take this  when to take this  reasons to take this  additional instructions   oxyCODONE 5 MG immediate release tablet Commonly known as:   Oxy IR/ROXICODONE Take 1 tablet (5 mg total) by mouth every 4 (four) hours as needed for moderate pain.   sertraline 100 MG tablet Commonly known as:  ZOLOFT TAKE 2 TABLETS BY MOUTH EVERY DAY What changed:    how much to take  how to take this  when to take this      Follow-up Information    [facility] Follow up.   Specialty:  General Surgery Why:  The office should call you on Monday with a follow-up appointment with you for the [facility] clinic in about 2 weeks.  If you do not hear from them next Monday call on Tuesday. Contact information: [address] [place] [address] [phone]           Signed: Sherrie George 07/22/2017, 7:54 AM

## 2017-07-22 NOTE — Care Management Note (Signed)
Case Management Note  Patient Details  Name: Christopher Hatfield MRN: [id number] Date of Birth: 1980/12/31  Subjective/Objective:        Pt s/p surgery. He is from home with spouse.            Action/Plan: Pt discharging home with self care. Pt has PCP, insurance and transportation home.   Expected Discharge Date:  07/22/17               Expected Discharge Plan:  Home/Self Care  In-House Referral:     Discharge planning Services     Post Acute Care Choice:    Choice offered to:     DME Arranged:    DME Agency:     HH Arranged:    HH Agency:     Status of Service:  Completed, signed off  If discussed at [employer] Length of Stay Meetings, dates discussed:    Additional Comments:  Kermit BaloKelli F Taline Nass, RN 07/22/2017, 2:09 PM

## 2017-07-22 NOTE — Progress Notes (Signed)
Pt arrived to rm 3w15 from PACU, walked from the stretcher to the bed. Oriented to rm. Call bell in reach. Bed locked and low. Family at bedside. Assessed skin with Hin, RN. Pain assessed. Incision intact x3. Pt DTV. VSS.

## 2017-07-22 NOTE — Discharge Instructions (Signed)
[facility] ______[facility] °LAPAROSCOPIC SURGERY: POST OP INSTRUCTIONS °Always review your discharge instruction sheet given to you by the facility where your surgery was performed. °IF YOU HAVE DISABILITY OR FAMILY LEAVE FORMS, YOU MUST BRING THEM TO THE OFFICE FOR PROCESSING.   °DO NOT GIVE THEM TO YOUR DOCTOR. ° °1. A prescription for pain medication may be given to you upon discharge.  Take your pain medication as prescribed, if needed.  If narcotic pain medicine is not needed, then you may take acetaminophen (Tylenol) or ibuprofen (Advil) as needed. °2. Take your usually prescribed medications unless otherwise directed. °3. If you need a refill on your pain medication, please contact your pharmacy.  They will contact our office to request authorization. Prescriptions will not be filled after 5pm or on week-ends. °4. You should follow a light diet the first few days after arrival home, such as soup and crackers, etc.  Be sure to include lots of fluids daily. °5. Most patients will experience some swelling and bruising in the area of the incisions.  Ice packs will help.  Swelling and bruising can take several days to resolve.  °6. It is common to experience some constipation if taking pain medication after surgery.  Increasing fluid intake and taking a stool softener (such as Colace) will usually help or prevent this problem from occurring.  A mild laxative (Milk of Magnesia or Miralax) should be taken according to package instructions if there are no bowel movements after 48 hours. °7. Unless discharge instructions indicate otherwise, you may remove your bandages 24-48 hours after surgery, and you may shower at that time.  You may have steri-strips (small skin tapes) in place directly over the incision.  These strips should be left on the skin for 7-10 days.  If your surgeon used skin glue on the incision, you may shower in 24 hours.  The glue will flake off over the next 2-3 weeks.  Any sutures or  staples will be removed at the office during your follow-up visit. °8. ACTIVITIES:  You may resume regular (light) daily activities beginning the next day--such as daily self-care, walking, climbing stairs--gradually increasing activities as tolerated.  You may have sexual intercourse when it is comfortable.  Refrain from any heavy lifting or straining until approved by your doctor. °a. You may drive when you are no longer taking prescription pain medication, you can comfortably wear a seatbelt, and you can safely maneuver your car and apply brakes. °b. RETURN TO WORK:  __________________________________________________________ °9. You should see your doctor in the office for a follow-up appointment approximately 2-3 weeks after your surgery.  Make sure that you call for this appointment within a day or two after you arrive home to insure a convenient appointment time. °10. OTHER INSTRUCTIONS: __________________________________________________________________________________________________________________________ __________________________________________________________________________________________________________________________ °WHEN TO CALL YOUR DOCTOR: °1. Fever over 101.0 °2. Inability to urinate °3. Continued bleeding from incision. °4. Increased pain, redness, or drainage from the incision. °5. Increasing abdominal pain ° °The clinic staff is available to answer your questions during regular business hours.  Please don’t hesitate to call and ask to speak to one of the nurses for clinical concerns.  If you have a medical emergency, go to the nearest emergency room or call 911.  A surgeon from [facility] is always on call at the hospital. °[address], [address], [place], [place]  [address] ? [address] [address], [place], [place]   [address] °[phone] ? [phone] ? FAX [phone] °Web site:   [email or web address] ° ° °Laparoscopic Appendectomy, Adult, Care After °Refer to  this sheet in the next few weeks. These instructions provide you with information about caring for yourself after your procedure. Your health care provider may also give you more specific instructions. Your treatment has been planned according to current medical practices, but problems sometimes occur. Call your health care provider if you have any problems or questions after your procedure. °What can I expect after the procedure? °After the procedure, it is common to have: °· A decrease in your energy level. °· Mild pain in the area where the surgical cuts (incisions) were made. °· Constipation. This can be caused by pain medicine and a decrease in your activity. ° °Follow these instructions at home: °Medicines °· Take over-the-counter and prescription medicines only as told by your health care provider. °· Do not drive for 24 hours if you received a sedative. °· Do not drive or operate heavy machinery while taking prescription pain medicine. °· If you were prescribed an antibiotic medicine, take it as told by your health care provider. Do not stop taking the antibiotic even if you start to feel better. °Activity °· For 3 weeks or as long as told by your health care provider: °? Do not lift anything that is heavier than 10 pounds (4.5 kg). °? Do not play contact sports. °· Gradually return to your normal activities. Ask your health care provider what activities are safe for you. °Bathing °· Keep your incisions clean and dry. Clean them as often as told by your health care provider: °? Gently wash the incisions with soap and water. °? Rinse the incisions with water to remove all soap. °? Pat the incisions dry with a clean towel. Do not rub the incisions. °· You may take showers after 48 hours. °· Do not take baths, swim, or use hot tubs for 2 weeks or as told by your health care provider. °Incision care °· Follow instructions from your healthcare provider about how to take care of your incisions. Make sure  you: °? Wash your hands with soap and water before you change your bandage (dressing). If soap and water are not available, use hand sanitizer. °? Change your dressing as told by your health care provider. °? Leave stitches (sutures), skin glue, or adhesive strips in place. These skin closures may need to stay in place for 2 weeks or longer. If adhesive strip edges start to loosen and curl up, you may trim the loose edges. Do not remove adhesive strips completely unless your health care provider tells you to do that. °· Check your incision areas every day for signs of infection. Check for: °? More redness, swelling, or pain. °? More fluid or blood. °? Warmth. °? Pus or a bad smell. °Other Instructions °· If you were sent home with a drain, follow instructions from your health care provider about how to care for the drain and how to empty it. °· Take deep breaths. This helps to prevent your lungs from becoming inflamed. °· To relieve and prevent constipation: °? Drink plenty of fluids. °? Eat plenty of fruits and vegetables. °· Keep all follow-up visits as told by your health care provider. This is important. °Contact a health care provider if: °· You have more redness, swelling, or pain around an incision. °· You have more fluid or blood coming from an incision. °· Your incision feels warm to the touch. °· You have pus or a bad smell coming from an incision or   dressing. °· Your incision edges break open after your sutures have been removed. °· You have increasing pain in your shoulders. °· You feel dizzy or you faint. °· You develop shortness of breath. °· You keep feeling nauseous or vomiting. °· You have diarrhea or you cannot control your bowel functions. °· You lose your appetite. °· You develop swelling or pain in your legs. °Get help right away if: °· You have a fever. °· You develop a rash. °· You have difficulty breathing. °· You have sharp pains in your chest. °This information is not intended to replace  advice given to you by your health care provider. Make sure you discuss any questions you have with your health care provider. °Document Released: 06/14/2005 Document Revised: 11/14/2015 Document Reviewed: 12/02/2014 °Elsevier Interactive Patient Education © 2018 [employer] ° °

## 2017-07-22 NOTE — Progress Notes (Signed)
1 Day Post-Op    WU:[id number]:Abdominal pain  Subjective: Pretty sore this AM after surgery, but no other complaints.    Objective: Vital signs in last 24 hours: Temp:  [98 F (36.7 C)-98.7 F (37.1 C)] 98.4 F (36.9 C) (01/25 0600) Pulse Rate:  [51-95] 51 (01/25 0600) Resp:  [15-25] 18 (01/25 0600) BP: (96-137)/(59-97) 98/68 (01/25 0600) SpO2:  [95 %-100 %] 100 % (01/25 0600) Weight:  [96.6 kg (212 lb 15.4 oz)-96.6 kg (213 lb)] 96.6 kg (212 lb 15.4 oz) (01/24 2045)  360 PO 2550 IV Urine 225 Afebrile vital signs are stable. No labs   Intake/Output from previous day: 01/24 0701 - 01/25 0700 In: 2912.5 [P.O.:360; I.V.:1152.5; IV Piggyback:1400] Out: 275 [Urine:225; Blood:50] Intake/Output this shift: No intake/output data recorded.  General appearance: alert, cooperative and no distress Resp: clear to auscultation bilaterally GI: soft, sore, port sites OK, No erythema.    Lab Results:  No results for input(s): WBC, HGB, HCT, PLT in the last 72 hours.  BMET No results for input(s): NA, K, CL, CO2, GLUCOSE, BUN, CREATININE, CALCIUM in the last 72 hours. PT/INR No results for input(s): LABPROT, INR in the last 72 hours.  No results for input(s): AST, ALT, ALKPHOS, BILITOT, PROT, ALBUMIN in the last 168 hours.   Lipase  No results found for: LIPASE   Prior to Admission medications   Medication Sig Start Date End Date Taking? Authorizing Provider  Aspirin-Salicylamide-Caffeine (BC HEADACHE) 325-95-16 MG TABS Take 2 each by mouth as needed.    Yes [provider]  cyclobenzaprine (FLEXERIL) 10 MG tablet Take 1 tablet (10 mg total) by mouth at bedtime as needed for muscle spasms. 02/05/16  Yes Bedsole, Amy E, MD  fluticasone (FLONASE) 50 MCG/ACT nasal spray PLACE 2 SPRAYS INTO BOTH NOSTRILS DAILY. 04/22/17  Yes Bedsole, Amy E, MD  ibuprofen (ADVIL,MOTRIN) 200 MG tablet Take 800 mg by mouth every 6 (six) hours as needed.   Yes [provider]  sertraline (ZOLOFT)  100 MG tablet TAKE 2 TABLETS BY MOUTH EVERY DAY Patient taking differently: TAKE 200 mg TABLETS BY MOUTH EVERY DAY 07/13/17  Yes Bedsole, Amy E, MD    Medications: . enoxaparin (LOVENOX) injection  40 mg Subcutaneous Q24H  . ketorolac  30 mg Intravenous Q6H  . sertraline  200 mg Oral Daily   . sodium chloride 75 mL/hr at 07/21/17 2118  . ciprofloxacin Stopped (07/21/17 2232)   And  . metronidazole 500 mg (07/22/17 0555)   Anti-infectives (From admission, onward)   Start     Dose/Rate Route Frequency Ordered Stop   07/21/17 2049  ciprofloxacin (CIPRO) IVPB 400 mg     400 mg 200 mL/hr over 60 Minutes Intravenous Every 12 hours 07/21/17 2049     07/21/17 2049  metroNIDAZOLE (FLAGYL) IVPB 500 mg     500 mg 100 mL/hr over 60 Minutes Intravenous Every 8 hours 07/21/17 2049     07/21/17 1827  vancomycin (VANCOCIN) 1-5 GM/200ML-% IVPB    Comments:  Lorenda IshiharaGibbs, Bonnie   : cabinet override      07/21/17 1827 07/22/17 0629      Assessment/Plan Acute appendicitis with generalized peritonitis S/P laparoscopic appendectomy 07/21/17, Dr. Manus RuddMatthew Tsuei  FEN: IV fluids/advancing diet ID: Preop Cipro/Flagyl DVT: Lovenox Follow-up:  [facility]    Plan:  Home later today when he can eat,drink, walk, and void.  Discussed with the RN and she will call if there are any issues.    LOS: 0 days  Sherrie George 07/22/2017 [phone]

## 2017-10-11 ENCOUNTER — Other Ambulatory Visit: Payer: Self-pay | Admitting: Family Medicine

## 2017-12-22 ENCOUNTER — Ambulatory Visit: Payer: BLUE CROSS/BLUE SHIELD | Admitting: Family Medicine

## 2017-12-22 ENCOUNTER — Encounter: Payer: Self-pay | Admitting: Family Medicine

## 2017-12-22 VITALS — BP 118/70 | HR 76 | Temp 97.5°F | Ht 67.75 in | Wt 218.0 lb

## 2017-12-22 DIAGNOSIS — M5416 Radiculopathy, lumbar region: Secondary | ICD-10-CM

## 2017-12-22 DIAGNOSIS — G8929 Other chronic pain: Secondary | ICD-10-CM | POA: Diagnosis not present

## 2017-12-22 DIAGNOSIS — J3489 Other specified disorders of nose and nasal sinuses: Secondary | ICD-10-CM

## 2017-12-22 MED ORDER — PREDNISONE 20 MG PO TABS: ORAL_TABLET | ORAL | 0 refills | Status: DC

## 2017-12-22 NOTE — Progress Notes (Signed)
   Subjective:    Patient ID: Christopher Hatfield, male    DOB: December 26, 1980, 37 y.o.   MRN: [id number]  HPI    37 year old male with history of chronic low back pain presents with flare of acute low back pain.   He reports  In last month  Recurrence of low back pain, radiating into left leg. Worse in last week. No numbness or weakness. No fever, no incontinence.  Using ibuprofen, tylenol with minimal relief. Has not had any flexeril in a while. He has started some home PT.   2051 MRI: IMPRESSION: 1. No high-grade spinal stenosis or focal disc herniation. 2. 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 At L3-4, extraforaminal right L3 nerve root encroachment is possible by asymmetric disc bulging laterally. No other evidence of nerve root encroachment.   No past history of  Surgery.  Dr. Ethelene Halamos gave ESI years a go.   He has been having soreness in right nostril, occ bloody snot. Felt slightly swollen.  Did have blood discharge. Now no longer sore.  Sinus pressure in forehead on temples.  He is using flonase  No fever. Minimal congested, no ST.    Social History /Family History/Past Medical History reviewed in detail and updated in EMR if needed. Blood pressure 118/70, pulse 76, temperature (!) 97.5 F (36.4 C), temperature source Oral, height 5' 7.75" (1.721 m), weight 218 lb (98.9 kg).  Review of Systems  Constitutional: Positive for fatigue. Negative for fever.  Eyes: Negative for pain.  Respiratory: Negative for cough and shortness of breath.   Cardiovascular: Negative for chest pain, palpitations and leg swelling.  Gastrointestinal: Negative for abdominal pain.  Genitourinary: Negative for dysuria.  Musculoskeletal: Negative for arthralgias.  Neurological: Negative for syncope, light-headedness and headaches.  Psychiatric/Behavioral: Negative for dysphoric mood.       Objective:   Physical Exam  Constitutional: Vital signs are normal.  He appears well-developed and well-nourished.  HENT:  Head: Normocephalic.  Right Ear: Hearing normal.  Left Ear: Hearing normal.  Nose: Nose normal. No sinus tenderness or nasal deformity. Right sinus exhibits no maxillary sinus tenderness and no frontal sinus tenderness. Left sinus exhibits no maxillary sinus tenderness and no frontal sinus tenderness.  Mouth/Throat: Oropharynx is clear and moist and mucous membranes are normal.  Right nostril lesion resolve.. Only mucus accumulation seen  Neck: Trachea normal. Carotid bruit is not present. No thyroid mass and no thyromegaly present.  Cardiovascular: Normal rate, regular rhythm and normal pulses. Exam reveals no gallop, no distant heart sounds and no friction rub.  No murmur heard. No peripheral edema  Pulmonary/Chest: Effort normal and breath sounds normal. No respiratory distress.  Musculoskeletal:       Lumbar back: He exhibits decreased range of motion and tenderness.  Skin: Skin is warm, dry and intact. No rash noted.  Psychiatric: He has a normal mood and affect. His speech is normal and behavior is normal. Thought content normal.          Assessment & Plan:

## 2017-12-22 NOTE — Patient Instructions (Addendum)
Stop flonase for now. No clear sign of infection. Restart PT at home.  Heat and gentle stretching.  If not improving can complete a course of prednisone x 6 days.  Call if still not improving for referral to PT.

## 2018-02-03 ENCOUNTER — Other Ambulatory Visit: Payer: Self-pay | Admitting: Family Medicine

## 2018-02-03 NOTE — Telephone Encounter (Signed)
Last office visit 12/22/2017.  AVS states to stop Flonase for now.  Refill?

## 2018-02-23 DIAGNOSIS — J3489 Other specified disorders of nose and nasal sinuses: Secondary | ICD-10-CM | POA: Insufficient documentation

## 2018-02-23 NOTE — Assessment & Plan Note (Signed)
Restart PT at home.  Heat and gentle stretching.  If not improving can complete a course of prednisone x 6 days.  Call if still not improving for referral to PT.

## 2018-02-23 NOTE — Assessment & Plan Note (Signed)
No clear sinus infection.. Lesion possibly due to flonase use. Stop. Viral URI possibly.. Symptomatic care.

## 2018-04-04 ENCOUNTER — Other Ambulatory Visit: Payer: Self-pay | Admitting: Family Medicine

## 2018-04-06 ENCOUNTER — Other Ambulatory Visit: Payer: BLUE CROSS/BLUE SHIELD

## 2018-06-07 ENCOUNTER — Telehealth: Payer: Self-pay | Admitting: Family Medicine

## 2018-06-07 NOTE — Telephone Encounter (Signed)
Left message asking pt to call office please r/s 12/26 appointment with dr bedsole °

## 2018-06-12 ENCOUNTER — Telehealth: Payer: Self-pay | Admitting: Family Medicine

## 2018-06-12 ENCOUNTER — Other Ambulatory Visit: Payer: BLUE CROSS/BLUE SHIELD

## 2018-06-12 DIAGNOSIS — Z13220 Encounter for screening for lipoid disorders: Secondary | ICD-10-CM

## 2018-06-12 NOTE — Telephone Encounter (Signed)
-----   Message from Wendi MayaLauren Greeson, RT sent at 06/06/2018  8:09 AM EST ----- Regarding: Lab orders for Monday Dec 16th Please enter CPE lab orders for 06/12/18. Thanks!

## 2018-06-20 ENCOUNTER — Telehealth: Payer: Self-pay | Admitting: Family Medicine

## 2018-06-20 NOTE — Telephone Encounter (Signed)
-----   Message from Alvina Chouerri J Walsh sent at 06/19/2018 11:14 AM EST ----- Regarding: Lab orders for Monday, 12.30.19 Patient is scheduled for CPX labs, please order future labs, Thanks , Camelia Engerri ,  Has a couple of lab orders from March.

## 2018-06-22 ENCOUNTER — Encounter: Payer: BLUE CROSS/BLUE SHIELD | Admitting: Family Medicine

## 2018-06-26 ENCOUNTER — Other Ambulatory Visit (INDEPENDENT_AMBULATORY_CARE_PROVIDER_SITE_OTHER): Payer: BLUE CROSS/BLUE SHIELD

## 2018-06-26 DIAGNOSIS — Z13220 Encounter for screening for lipoid disorders: Secondary | ICD-10-CM

## 2018-06-26 LAB — COMPREHENSIVE METABOLIC PANEL
ALK PHOS: 46 U/L (ref 39–117)
ALT: 18 U/L (ref 0–53)
AST: 20 U/L (ref 0–37)
Albumin: 4.5 g/dL (ref 3.5–5.2)
BUN: 16 mg/dL (ref 6–23)
CALCIUM: 9.3 mg/dL (ref 8.4–10.5)
CHLORIDE: 105 meq/L (ref 96–112)
CO2: 27 meq/L (ref 19–32)
CREATININE: 0.9 mg/dL (ref 0.40–1.50)
GFR: 99.45 mL/min (ref >60.00)
GLUCOSE: 93 mg/dL (ref 70–99)
Potassium: 4.7 mEq/L (ref 3.5–5.1)
Sodium: 138 mEq/L (ref 135–145)
Total Bilirubin: 0.4 mg/dL (ref 0.2–1.2)
Total Protein: 6.6 g/dL (ref 6.0–8.3)

## 2018-06-26 LAB — LIPID PANEL
CHOLESTEROL: 186 mg/dL (ref 0–200)
HDL: 52.2 mg/dL (ref >39.00)
LDL CALC: 125 mg/dL — AB (ref 0–99)
NonHDL: 134.11
Total CHOL/HDL Ratio: 4
Triglycerides: 48 mg/dL (ref 0.0–149.0)
VLDL: 9.6 mg/dL (ref 0.0–40.0)

## 2018-06-30 ENCOUNTER — Encounter: Payer: Self-pay | Admitting: Family Medicine

## 2018-06-30 ENCOUNTER — Ambulatory Visit (INDEPENDENT_AMBULATORY_CARE_PROVIDER_SITE_OTHER): Payer: BLUE CROSS/BLUE SHIELD | Admitting: Family Medicine

## 2018-06-30 VITALS — BP 104/66 | HR 71 | Temp 98.4°F | Ht 68.0 in | Wt 200.5 lb

## 2018-06-30 DIAGNOSIS — F331 Major depressive disorder, recurrent, moderate: Secondary | ICD-10-CM

## 2018-06-30 DIAGNOSIS — Z0000 Encounter for general adult medical examination without abnormal findings: Secondary | ICD-10-CM | POA: Diagnosis not present

## 2018-06-30 DIAGNOSIS — E669 Obesity, unspecified: Secondary | ICD-10-CM | POA: Diagnosis not present

## 2018-06-30 MED ORDER — BUPROPION HCL ER (XL) 150 MG PO TB24: 150.0000 mg | ORAL_TABLET | Freq: Every day | ORAL | 3 refills | Status: DC

## 2018-06-30 NOTE — Assessment & Plan Note (Signed)
Encouraged exercise, weight loss, healthy eating habits. ? ?

## 2018-06-30 NOTE — Patient Instructions (Addendum)
Stop sertraline.  Start wellbutrin  XL daily.

## 2018-06-30 NOTE — Assessment & Plan Note (Signed)
Inadequate control. Stop sertraline and change to wellbutrin.  follow up in 1 month.

## 2018-06-30 NOTE — Progress Notes (Signed)
Subjective:    Patient ID: Christopher Hatfield, male    DOB: 1981/04/21, 38 y.o.   MRN: [id number]  HPI  The patient is here for annual wellness exam and preventative care.    Chronic low back pain:  Has had some recent mid back pain.. sore. Has not done heating pad, minimal stretching.  Stable low back pain. Tylenol or ibuprofen helps.  MDD:  Gradually worsened in last 3 months on sertraline 200 mg daily. Easily irritable. Angry. Sertraline has stopped helping.  Using melatonin to sleep at night.. helps.  Poor control on prozac in past.  Allergic rhinitis : On flonase and allegra.  Healthy diet ( keto)   Exercise: walking daily.   Wt Readings from Last 3 Encounters:  06/30/18 200 lb 8 oz (90.9 kg)  12/22/17 218 lb (98.9 kg)  07/21/17 212 lb 15.4 oz (96.6 kg)    Reviewed labs in detail  Lab Results  Component Value Date   CHOL 186 06/26/2018   HDL 52.20 06/26/2018   LDLCALC 125 (H) 06/26/2018   TRIG 48.0 06/26/2018   CHOLHDL 4 06/26/2018    Social History /Family History/Past Medical History reviewed in detail and updated in EMR if needed. Blood pressure 104/66, pulse 71, temperature 98.4 F (36.9 C), temperature source Oral, height 5\' 8"  (1.727 m), weight 200 lb 8 oz (90.9 kg).  Review of Systems  Constitutional: Negative for fatigue and fever.  HENT: Negative for ear pain.   Eyes: Negative for pain.  Respiratory: Negative for cough and shortness of breath.   Cardiovascular: Negative for chest pain, palpitations and leg swelling.  Gastrointestinal: Negative for abdominal pain.  Genitourinary: Negative for dysuria.  Musculoskeletal: Negative for arthralgias.  Neurological: Negative for syncope, light-headedness and headaches.  Psychiatric/Behavioral: Negative for dysphoric mood.       Objective:   Physical Exam Constitutional:      General: He is not in acute distress.    Appearance: Normal appearance. He is well-developed. He is not ill-appearing or  toxic-appearing.  HENT:     Head: Normocephalic and atraumatic.     Right Ear: Hearing, tympanic membrane, ear canal and external ear normal.     Left Ear: Hearing, tympanic membrane, ear canal and external ear normal.     Nose: Nose normal.     Mouth/Throat:     Pharynx: Uvula midline.  Eyes:     General: Lids are normal. Lids are everted, no foreign bodies appreciated.     Conjunctiva/sclera: Conjunctivae normal.     Pupils: Pupils are equal, round, and reactive to light.  Neck:     Musculoskeletal: Normal range of motion and neck supple.     Thyroid: No thyroid mass or thyromegaly.     Vascular: No carotid bruit.     Trachea: Trachea and phonation normal.  Cardiovascular:     Rate and Rhythm: Normal rate and regular rhythm.     Pulses: Normal pulses.     Heart sounds: S1 normal and S2 normal. Heart sounds not distant. No murmur. No friction rub. No gallop.      Comments: No peripheral edema Pulmonary:     Effort: Pulmonary effort is normal. No respiratory distress.     Breath sounds: Normal breath sounds. No wheezing, rhonchi or rales.  Abdominal:     General: Bowel sounds are normal.     Palpations: Abdomen is soft.     Tenderness: There is no abdominal tenderness. There is no guarding or rebound.  Hernia: No hernia is present.  Lymphadenopathy:     Cervical: No cervical adenopathy.  Skin:    General: Skin is warm and dry.     Findings: No rash.  Neurological:     Mental Status: He is alert.     Cranial Nerves: No cranial nerve deficit.     Sensory: No sensory deficit.     Gait: Gait normal.     Deep Tendon Reflexes: Reflexes are normal and symmetric.  Psychiatric:        Speech: Speech normal.        Behavior: Behavior normal.        Thought Content: Thought content normal.        Judgment: Judgment normal.           Assessment & Plan:  The patient's preventative maintenance and recommended screening tests for an annual wellness exam were reviewed in full  today. Brought up to date unless services declined.  Counselled on the importance of diet, exercise, and its role in overall health and mortality. The patient's FH and SH was reviewed, including their home life, tobacco status, and drug and alcohol status.   Vaccines: Up to date except refused flu vaccine Colon: no early family history. Prostate cancer in dad at age early 55s No concerns in genital or rectal area. Nonsmoker.  HIV testing in past.

## 2018-07-07 ENCOUNTER — Telehealth: Payer: Self-pay | Admitting: *Deleted

## 2018-07-07 NOTE — Telephone Encounter (Signed)
Patient called stating that he stopped taking the Sertraline and switched over to Wellbutrin on Saturday per Dr. Ermalene Searing. Patient stated that he has been having some dizziness, lightheadedness and confusion since switching to Wellbutrin. Patient stated that he wonders if he is having withdrawal from the Sertraline and wants to know what he should do? [facility]

## 2018-07-07 NOTE — Telephone Encounter (Signed)
Try taking wellbutrin at bedtime... give it 1 week to resolve.  Not withdrawal.

## 2018-07-07 NOTE — Telephone Encounter (Signed)
Left message for Christopher Hatfield that Dr. Ermalene Searing recommends that he take his Wellbutrin at bedtime and give it 1 week to resolve. Advised Dr. Ermalene Searing does not feel that it is withdrawal from Sertraline.

## 2018-07-10 ENCOUNTER — Other Ambulatory Visit: Payer: Self-pay | Admitting: Family Medicine

## 2018-07-11 ENCOUNTER — Telehealth: Payer: Self-pay

## 2018-07-11 NOTE — Telephone Encounter (Signed)
Pt left v/m that dizziness has resolved. Pt is irritable and depressed and wants to know how long will pt have to wait to get dosage changed. I spoke with pt; pt has been taking wellbutrin XL 150 mg one daily. Pt is feeling irritable like he is "walking on eggshells all the time". Pt is feeling depressed and is worse than when last seen 06/30/2018. Pt said he would not harm himself or anyone else. Pt wants to know if can increase wellbutrin. Pt has FU appt on 08/01/18. Pt request cb after Dr Ermalene Searing reviews. If pt condition worsens prior to cb pt will call [facility]. If pt were to experience feelings of SI or HI pt is to go to ED immediately. Pt voiced understanding. [facility].

## 2018-07-11 NOTE — Telephone Encounter (Signed)
Mr. Wickey notified as instructed by telephone.  Patient states understanding. 

## 2018-07-11 NOTE — Telephone Encounter (Signed)
He can try a trial of increasing to 2 tabs daily of wellbutrin.  Call with update on mood in 2 weeks.

## 2018-07-19 MED ORDER — BUPROPION HCL ER (XL) 300 MG PO TB24: 300.0000 mg | ORAL_TABLET | Freq: Every day | ORAL | 3 refills | Status: DC

## 2018-07-19 NOTE — Addendum Note (Signed)
Addended by: Patience Musca on: 07/19/2018 10:47 AM   Modules accepted: Orders

## 2018-07-19 NOTE — Telephone Encounter (Signed)
Okay to refill at 300mg  XL qhs #30 3 RF.  If you want me to verify and sign prescription.. send to me for signature.

## 2018-07-19 NOTE — Addendum Note (Signed)
Addended by: Damita Lack on: 07/19/2018 02:36 PM   Modules accepted: Orders

## 2018-07-19 NOTE — Telephone Encounter (Signed)
Christopher Hatfield notified by telephone that Dr. Ermalene Searing has sent him in a prescription for Wellbutrin XL 300 mg to take one tablet at bedtime.  Advised to keep his follow up appointment already scheduled for 08/01/2018.

## 2018-07-19 NOTE — Telephone Encounter (Signed)
Pt called back with update; pt has been taking the wellbutrin XL 150 mg two tabs  6 - 7 pm at night. Pt said he is feeling better and more manageable with level of irritability, anger and depression. Pt said it is not overwhelming now. Pt still has a degree of irritability and depression and Dr Ermalene Searing had discussed adding sertraline to wellbutrin if needed but pt said since taking the 2 tabs of wellbutrin pt has dizziness the next day. Time varies how long pt is dizzy.pt prefers not to add med at this time since experiencing dizziness. Pt has 2 tabs for tonight but will need wellbutrin with new directions sent to [facility]. Pt request cb with Dr Albertina Senegal comments or instructions.

## 2018-08-01 ENCOUNTER — Ambulatory Visit: Payer: BLUE CROSS/BLUE SHIELD | Admitting: Family Medicine

## 2018-08-01 ENCOUNTER — Encounter: Payer: Self-pay | Admitting: Family Medicine

## 2018-08-01 VITALS — BP 110/80 | HR 60 | Temp 97.4°F | Ht 68.0 in | Wt 198.8 lb

## 2018-08-01 DIAGNOSIS — F331 Major depressive disorder, recurrent, moderate: Secondary | ICD-10-CM | POA: Diagnosis not present

## 2018-08-01 MED ORDER — SERTRALINE HCL 50 MG PO TABS: 50.0000 mg | ORAL_TABLET | Freq: Every day | ORAL | 3 refills | Status: DC

## 2018-08-01 NOTE — Progress Notes (Signed)
   Subjective:    Patient ID: Christopher Hatfield, male    DOB: [birth year], 38 y.o.   MRN: [id number]  HPI  38 year old male presents for 1 month follow up MDD, recurrent.  He is now on Wellbutrin Xl 300 mg daily... for the last 3 weeks.  Occ lightheadeness, but initial SE have improved. He has noted improvement in mood... he still has poor patience and frequent mood fluctuations.Marland Kitchen 1-2 times a day.  he feels better overall, but not where he wants to be.  He feels like he was more depressed on sertrlaine but not as quick to anger.   Office Visit from 08/01/2018 in [facility] [place]  PHQ-9 Total Score  9       Social History /Family History/Past Medical History reviewed in detail and updated in EMR if needed. Blood pressure 110/80, pulse 60, temperature (!) 97.4 F (36.3 C), temperature source Oral, height 5\' 8"  (1.727 m), weight 198 lb 12 oz (90.2 kg), SpO2 97 %.   Review of Systems  Constitutional: Negative for fatigue and fever.  HENT: Negative for ear pain.   Eyes: Negative for pain.  Respiratory: Negative for cough and shortness of breath.   Cardiovascular: Negative for chest pain, palpitations and leg swelling.  Gastrointestinal: Negative for abdominal pain.  Genitourinary: Negative for dysuria.  Musculoskeletal: Negative for arthralgias.  Neurological: Negative for syncope, light-headedness and headaches.  Psychiatric/Behavioral: Negative for dysphoric mood.       Objective:   Physical Exam Constitutional:      Appearance: He is well-developed.  HENT:     Head: Normocephalic.     Right Ear: Hearing normal.     Left Ear: Hearing normal.     Nose: Nose normal.  Neck:     Thyroid: No thyroid mass or thyromegaly.     Vascular: No carotid bruit.     Trachea: Trachea normal.  Cardiovascular:     Rate and Rhythm: Normal rate and regular rhythm.     Pulses: Normal pulses.     Heart sounds: Heart sounds not distant. No murmur. No friction rub. No gallop.        Comments: No peripheral edema Pulmonary:     Effort: Pulmonary effort is normal. No respiratory distress.     Breath sounds: Normal breath sounds.  Skin:    General: Skin is warm and dry.     Findings: No rash.  Psychiatric:        Attention and Perception: Attention normal.        Mood and Affect: Affect is blunt.        Speech: Speech normal.        Behavior: Behavior normal.        Thought Content: Thought content normal.        Cognition and Memory: Cognition normal.        Judgment: Judgment normal.           Assessment & Plan:

## 2018-08-01 NOTE — Assessment & Plan Note (Signed)
Had better impulse and anger control on sertrlaine but more depression. Will add back low dose sertraline in addition to wellbutrin  If not improving mood in 2 more weeks.  Follow up in 6 weeks.

## 2018-08-01 NOTE — Patient Instructions (Signed)
Given wellbutrin 2 more weeks to be fully effective. If still not feeling as you would like with irritability... start low dose sertraline 50 mg daily.

## 2018-08-10 ENCOUNTER — Other Ambulatory Visit: Payer: Self-pay | Admitting: Family Medicine

## 2018-08-25 ENCOUNTER — Other Ambulatory Visit: Payer: Self-pay | Admitting: Family Medicine

## 2018-09-08 ENCOUNTER — Ambulatory Visit: Payer: BLUE CROSS/BLUE SHIELD | Admitting: Family Medicine

## 2018-09-08 ENCOUNTER — Other Ambulatory Visit: Payer: Self-pay

## 2018-09-08 ENCOUNTER — Encounter: Payer: Self-pay | Admitting: Family Medicine

## 2018-09-08 VITALS — BP 108/70 | HR 64 | Temp 98.2°F | Wt 196.0 lb

## 2018-09-08 DIAGNOSIS — G8929 Other chronic pain: Secondary | ICD-10-CM | POA: Diagnosis not present

## 2018-09-08 DIAGNOSIS — F331 Major depressive disorder, recurrent, moderate: Secondary | ICD-10-CM | POA: Diagnosis not present

## 2018-09-08 DIAGNOSIS — M5416 Radiculopathy, lumbar region: Secondary | ICD-10-CM | POA: Diagnosis not present

## 2018-09-08 MED ORDER — TRAMADOL HCL 50 MG PO TABS: 50.0000 mg | ORAL_TABLET | Freq: Three times a day (TID) | ORAL | 0 refills | Status: DC | PRN

## 2018-09-08 NOTE — Patient Instructions (Signed)
We will call you with PT referral.  Can use tramadol as needed for pain.Marland Kitchen limit the use.  Heat on low back.  Continue wellbutrin and sertaline.

## 2018-09-08 NOTE — Assessment & Plan Note (Signed)
Significantly improved mood on sertraline and wellbutrin combination. Less anger, good sleep.

## 2018-09-08 NOTE — Progress Notes (Signed)
Subjective:    Patient ID: Christopher Hatfield, male    DOB: 05/16/1981, 38 y.o.   MRN: [id number]  HPI    38 year old male presents for follow up on mood.  At last OV 1 month ago sertraline was added back to wellbutrin.  On wellbutrin alone he had noted improved depression but still issues with mood and anger control.  Today he reports his mood is improved on this regimen. He has more control of anger.Marland Kitchen able to handle stressful events.  No issues with sleep. Sleeps 8 hours a night.  No SI, no HI.  No SE to the regimen.  He has a history old chronic radicular low back pain.Marland Kitchen in 2018 was controlled on gabapentin.. was able to stop given was doing better.  He has tried stretching, heat, ibuprofen and aleve.. nothing helps with pain.Prednsione in 01/2018.. helped some but pain has come back in last several months. Pain is central low back, pressure, occ stabbing pain.Marland Kitchen no radiation at this point to leg.  No numbness or weakness.  Stretching seems to help with any radiculopathy he had in past. Pain 6-7/10 on pain scale, bothering him 2-3 days a week.  Last MRI 2015: 1. No high-grade spinal stenosis or focal disc herniation. 2. 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 At L3-4, extraforaminal right L3 nerve root encroachment is possible by asymmetric disc bulging laterally. No other evidence of nerve root encroachment. 3. No acute osseous findings or malalignment.  Does not feel gabapentin helped in past. ESI ineffective per pt in past. Lyrica to expensive.  Never tried amitryptiline or cymablta in past.  SSRI has not helped back pain at all.   Social History /Family History/Past Medical History reviewed in detail and updated in EMR if needed. Blood pressure 108/70, pulse 64, temperature 98.2 F (36.8 C), temperature source Oral, weight 196 lb (88.9 kg), SpO2 98 %.  Review of Systems  Constitutional: Negative for fatigue and fever.  HENT: Negative  for ear pain.   Eyes: Negative for pain.  Respiratory: Negative for cough and shortness of breath.   Cardiovascular: Negative for chest pain, palpitations and leg swelling.  Gastrointestinal: Negative for abdominal pain.  Genitourinary: Negative for dysuria.  Musculoskeletal: Positive for back pain. Negative for arthralgias.  Neurological: Negative for syncope, light-headedness and headaches.  Psychiatric/Behavioral: Negative for dysphoric mood.       Objective:   Physical Exam Constitutional:      Appearance: He is well-developed.  HENT:     Head: Normocephalic.     Right Ear: Hearing normal.     Left Ear: Hearing normal.     Nose: Nose normal.  Neck:     Thyroid: No thyroid mass or thyromegaly.     Vascular: No carotid bruit.     Trachea: Trachea normal.  Cardiovascular:     Rate and Rhythm: Normal rate and regular rhythm.     Pulses: Normal pulses.     Heart sounds: Heart sounds not distant. No murmur. No friction rub. No gallop.      Comments: No peripheral edema Pulmonary:     Effort: Pulmonary effort is normal. No respiratory distress.     Breath sounds: Normal breath sounds.  Musculoskeletal:     Lumbar back: He exhibits normal range of motion, no tenderness, no bony tenderness and no swelling.     Comments: Pints to pain in low back .. no current focal pain  Neg faber's and SLR  bilaterally.  Skin:    General: Skin is warm and dry.     Findings: No rash.  Psychiatric:        Speech: Speech normal.        Behavior: Behavior normal.        Thought Content: Thought content normal.           Assessment & Plan:

## 2018-09-20 DIAGNOSIS — M6281 Muscle weakness (generalized): Secondary | ICD-10-CM | POA: Diagnosis not present

## 2018-09-20 DIAGNOSIS — M545 Low back pain: Secondary | ICD-10-CM | POA: Diagnosis not present

## 2018-09-20 DIAGNOSIS — M5416 Radiculopathy, lumbar region: Secondary | ICD-10-CM | POA: Diagnosis not present

## 2018-09-26 ENCOUNTER — Other Ambulatory Visit: Payer: Self-pay | Admitting: Family Medicine

## 2018-09-27 NOTE — Telephone Encounter (Signed)
Last office visit 09/08/2018 for back pain.  Last refilled 09/08/2018 for #30 with no refills.   No future appointments.

## 2018-11-01 ENCOUNTER — Other Ambulatory Visit: Payer: Self-pay | Admitting: Family Medicine

## 2018-11-02 NOTE — Telephone Encounter (Signed)
Last office visit 09/08/2018 for back pain/depression.  Last refilled 09/27/2018 for #21 with 1 refill.  No future appointments.

## 2018-11-16 ENCOUNTER — Other Ambulatory Visit: Payer: Self-pay | Admitting: Family Medicine

## 2018-11-22 ENCOUNTER — Other Ambulatory Visit: Payer: Self-pay | Admitting: Family Medicine

## 2018-11-25 ENCOUNTER — Telehealth: Payer: BLUE CROSS/BLUE SHIELD | Admitting: Nurse Practitioner

## 2018-11-25 DIAGNOSIS — A09 Infectious gastroenteritis and colitis, unspecified: Secondary | ICD-10-CM | POA: Diagnosis not present

## 2018-11-25 MED ORDER — CIPROFLOXACIN HCL 500 MG PO TABS: 500.0000 mg | ORAL_TABLET | Freq: Two times a day (BID) | ORAL | 0 refills | Status: DC

## 2018-11-25 NOTE — Progress Notes (Signed)
We are sorry that you are not feeling well.  Here is how we plan to help!  Based on what you have shared with me it looks like you have Acute Infectious Diarrhea.  Most cases of acute diarrhea are due to infections with virus and bacteria and are self-limited conditions lasting less than 14 days.  For your symptoms you may take Imodium 2 mg tablets that are over the counter at your local pharmacy. Take two tablet now and then one after each loose stool up to 6 a day.  Antibiotics are not needed for most people with diarrhea.    Optional: I have sent in Cipro 500 mg twice a day for seven days   HOME CARE  We recommend changing your diet to help with your symptoms for the next few days.  Drink plenty of fluids that contain water salt and sugar. Sports drinks such as Gatorade may help.   You may try broths, soups, bananas, applesauce, soft breads, mashed potatoes or crackers.   You are considered infectious for as long as the diarrhea continues. Hand washing or use of alcohol based hand sanitizers is recommend.  It is best to stay out of work or school until your symptoms stop.   GET HELP RIGHT AWAY  If you have dark yellow colored urine or do not pass urine frequently you should drink more fluids.    If your symptoms worsen   If you feel like you are going to pass out (faint)  You have a new problem  MAKE SURE YOU   Understand these instructions.  Will watch your condition.  Will get help right away if you are not doing well or get worse.  Your e-visit answers were reviewed by a board certified advanced clinical practitioner to complete your personal care plan.  Depending on the condition, your plan could have included both over the counter or prescription medications.  If there is a problem please reply  once you have received a response from your provider.  Your safety is important to [place].  If you have drug allergies check your prescription carefully.    You can use  MyChart to ask questions about today's visit, request a non-urgent call back, or ask for a work or school excuse for 24 hours related to this e-Visit. If it has been greater than 24 hours you will need to follow up with your provider, or enter a new e-Visit to address those concerns.   You will get an e-mail in the next two days asking about your experience.  I hope that your e-visit has been valuable and will speed your recovery. Thank you for using e-visits.  5-10 minutes spent reviewing and documenting in chart.

## 2018-12-04 ENCOUNTER — Other Ambulatory Visit: Payer: Self-pay | Admitting: Family Medicine

## 2018-12-05 NOTE — Telephone Encounter (Signed)
Last office visit 09/08/2018 for back pain.  Last refilled 05/07/202 for #21 with 1 refill.  No future appointments.

## 2018-12-28 ENCOUNTER — Other Ambulatory Visit: Payer: Self-pay | Admitting: Family Medicine

## 2018-12-28 NOTE — Telephone Encounter (Signed)
Last office visit 09/08/2018 for back pain and depression.  Last refilled 12/05/2018 for #21 with 1 refill.  No future appointments.

## 2019-01-16 ENCOUNTER — Telehealth: Payer: Self-pay | Admitting: Family Medicine

## 2019-01-16 DIAGNOSIS — F331 Major depressive disorder, recurrent, moderate: Secondary | ICD-10-CM

## 2019-01-16 NOTE — Telephone Encounter (Signed)
The last time patient came in to see Dr.Bedsole, she mentioned that she could refer patient to a therapist.  Patient said he's interested in being referred to a therapist.  Patient can go anytime.

## 2019-01-18 NOTE — Telephone Encounter (Signed)
Referral sent 

## 2019-01-19 ENCOUNTER — Other Ambulatory Visit: Payer: Self-pay | Admitting: Family Medicine

## 2019-01-19 NOTE — Telephone Encounter (Signed)
Electronic refill request Tramadol Last refill 12/28/18 #21/1 refill Last office visit 09/08/18

## 2019-01-30 ENCOUNTER — Telehealth: Payer: Self-pay

## 2019-01-30 ENCOUNTER — Ambulatory Visit (INDEPENDENT_AMBULATORY_CARE_PROVIDER_SITE_OTHER): Payer: BC Managed Care – PPO | Admitting: Internal Medicine

## 2019-01-30 ENCOUNTER — Encounter: Payer: Self-pay | Admitting: Internal Medicine

## 2019-01-30 DIAGNOSIS — B9689 Other specified bacterial agents as the cause of diseases classified elsewhere: Secondary | ICD-10-CM

## 2019-01-30 DIAGNOSIS — J019 Acute sinusitis, unspecified: Secondary | ICD-10-CM | POA: Diagnosis not present

## 2019-01-30 MED ORDER — DOXYCYCLINE HYCLATE 100 MG PO TABS: 100.0000 mg | ORAL_TABLET | Freq: Two times a day (BID) | ORAL | 0 refills | Status: DC

## 2019-01-30 NOTE — Patient Instructions (Signed)

## 2019-01-30 NOTE — Progress Notes (Addendum)
Virtual Visit via Video Note  I connected with Christopher Hatfield on 01/30/19 at  3:00 PM EDT by a  telephone and verified that I am speaking with the correct person using two identifiers.  Location: Patient: Work Provider: Office   I discussed the limitations of evaluation and management by telephone and the availability of in person appointments. The patient expressed understanding and agreed to proceed.  History of Present Illness:  Pt reports headache, nasal congestion and swollen glands. This started 2 weeks ago. The headache is located in his forehead. He describes the pain as pressure. He denies dizziness, visual changes, sensitivity to light and sound, nausea or vomiting. He is blowing green mucous out of his nose. He reports he can smell/taste infection in his throat. He denies fever, chills, body aches or SOB. He has no known exposure to COVID but does travel for work.    Past Medical History:  Diagnosis Date  . Carpal tunnel syndrome of right wrist 08/2013    Current Outpatient Medications  Medication Sig Dispense Refill  . acetaminophen (TYLENOL) 325 MG tablet You can take 2 tablets every 4 hours as needed for pain.  You can alternate with Ibuprofen, or Oxycodone.  Do not exceed 4000 mg per day. (Patient taking differently: You can take 2 tablets every 4 hours as needed for pain.)    . Aspirin-Salicylamide-Caffeine (BC HEADACHE) 325-95-16 MG TABS Take 2 each by mouth as needed.     Marland Kitchen buPROPion (WELLBUTRIN XL) 300 MG 24 hr tablet TAKE 1 TABLET BY MOUTH EVERYDAY AT BEDTIME 90 tablet 1  . ciprofloxacin (CIPRO) 500 MG tablet Take 1 tablet (500 mg total) by mouth 2 (two) times daily. 20 tablet 0  . fluticasone (FLONASE) 50 MCG/ACT nasal spray SPRAY 2 SPRAYS INTO EACH NOSTRIL EVERY DAY (Patient not taking: Reported on 09/08/2018) 9.9 g 7  . ibuprofen (ADVIL,MOTRIN) 200 MG tablet You can take 2-3 tablets every 6 hours as needed for pain.  You can alternate with Tylenol(acetaminophen) or  the oxycodone.  I would not exceed this amount.  Ibuprofen can hurt your kidneys, and cause an ulcer.    You can buy this over the counter at any drug store. (Patient not taking: Reported on 09/08/2018) 30 tablet 0  . sertraline (ZOLOFT) 50 MG tablet TAKE 1 TABLET BY MOUTH EVERY DAY 90 tablet 1  . traMADol (ULTRAM) 50 MG tablet TAKE 1 TABLET BY MOUTH EVERY 8 HOURS AS NEEDED 30 tablet 1   No current facility-administered medications for this visit.     Allergies  Allergen Reactions  . Penicillins Swelling    Has patient had a PCN reaction causing immediate rash, facial/tongue/throat swelling, SOB or lightheadedness with hypotension: no [id number]} Has patient had a PCN reaction causing severe rash involving mucus membranes or skin necrosis: No Has patient had a PCN reaction that required hospitalization: No Has patient had a PCN reaction occurring within the last 10 years: No If all of the above answers are "NO", then may proceed with Cephalosporin use.  . Sulfonamide Derivatives Swelling    Family History  Problem Relation Age of Onset  . Depression Mother   . Deep vein thrombosis Father   . Heart attack Maternal Grandmother   . Diabetes Paternal Grandmother   . Cancer Paternal Grandfather        PROSTATE    Social History   Socioeconomic History  . Marital status: Married    Spouse name: Not on file  . Number of  children: 2  . Years of education: Not on file  . Highest education level: Not on file  Occupational History  . Occupation: tow truck Air traffic controllerdriver    Employer: [employer]  Social Needs  . Financial resource strain: Not on file  . Food insecurity    Worry: Not on file    Inability: Not on file  . Transportation needs    Medical: Not on file    Non-medical: Not on file  Tobacco Use  . Smoking status: Former Games developermoker  . Smokeless tobacco: Former NeurosurgeonUser  . Tobacco comment: quit smoking 6 years ago  Substance and Sexual Activity  . Alcohol use: No    Alcohol/week:  0.0 standard drinks  . Drug use: No  . Sexual activity: Not on file  Lifestyle  . Physical activity    Days per week: Not on file    Minutes per session: Not on file  . Stress: Not on file  Relationships  . Social Musicianconnections    Talks on phone: Not on file    Gets together: Not on file    Attends religious service: Not on file    Active member of club or organization: Not on file    Attends meetings of clubs or organizations: Not on file    Relationship status: Not on file  . Intimate partner violence    Fear of current or ex partner: Not on file    Emotionally abused: Not on file    Physically abused: Not on file    Forced sexual activity: Not on file  Other Topics Concern  . Not on file  Social History Narrative   Regular exercise-- no      Diet: Avoid fast food, some fruits and veggies     Constitutional: Pt reports headache. Denies fever, malaise, fatigue, or abrupt weight changes.  HEENT: Pt reports nasal congestion. Denies eye pain, eye redness, ear pain, ringing in the ears, wax buildup, runny nose, bloody nose, or sore throat. Respiratory: Denies difficulty breathing, shortness of breath, cough or sputum production.   Cardiovascular: Denies chest pain, chest tightness, palpitations or swelling in the hands or feet.  Gastrointestinal: Denies abdominal pain, bloating, constipation, diarrhea or blood in the stool.    No other specific complaints in a complete review of systems (except as listed in HPI above).  Observations/Objective:  Wt Readings from Last 3 Encounters:  09/08/18 196 lb (88.9 kg)  08/01/18 198 lb 12 oz (90.2 kg)  06/30/18 200 lb 8 oz (90.9 kg)    Neurological: Alert and oriented.   BMET    Component Value Date/Time   NA 138 06/26/2018 0826   K 4.7 06/26/2018 0826   CL 105 06/26/2018 0826   CO2 27 06/26/2018 0826   GLUCOSE 93 06/26/2018 0826   BUN 16 06/26/2018 0826   CREATININE 0.90 06/26/2018 0826   CREATININE 1.05 03/04/2017 1503    CALCIUM 9.3 06/26/2018 0826   GFRNONAA 85 09/05/2007 0909   GFRAA 103 09/05/2007 0909    Lipid Panel     Component Value Date/Time   CHOL 186 06/26/2018 0826   TRIG 48.0 06/26/2018 0826   HDL 52.20 06/26/2018 0826   CHOLHDL 4 06/26/2018 0826   VLDL 9.6 06/26/2018 0826   LDLCALC 125 (H) 06/26/2018 0826   LDLCALC 110 (H) 03/04/2017 1503    CBC    Component Value Date/Time   WBC 4.4 (L) 03/14/2013 0754   RBC 4.80 03/14/2013 0754   HGB 13.9 09/07/2013  1253   HCT 41.7 03/14/2013 0754   PLT 261.0 03/14/2013 0754   MCV 86.7 03/14/2013 0754   MCHC 33.8 03/14/2013 0754   RDW 13.3 03/14/2013 0754   LYMPHSABS 1.8 03/14/2013 0754   MONOABS 0.4 03/14/2013 0754   EOSABS 0.4 03/14/2013 0754   BASOSABS 0.0 03/14/2013 0754    Hgb A1C No results found for: HGBA1C      Assessment and Plan:  Acute Bacterial Sinusitis:  Continue Claritin and Flonase RX for Doxy 100 mg BID x 10 days- avoid sun exposure He declines COVID testing at this time  Return precautions discussed  Follow Up Instructions:    I discussed the assessment and treatment plan with the patient. The patient was provided an opportunity to ask questions and all were answered. The patient agreed with the plan and demonstrated an understanding of the instructions.   The patient was advised to call back or seek an in-person evaluation if the symptoms worsen or if the condition fails to improve as anticipated.  I provided 5 minutes of non-face-to-face time during this encounter.   Nicki Reaperegina Deklyn Trachtenberg, NP

## 2019-01-30 NOTE — Telephone Encounter (Signed)
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 For 2 wks pt has had a severe H/A and when blows nose has thick green mucus. Pt has hx of sinus infections. Pt said smells stink when breathes in like sinus infection. Lt jaw gland is swollen;no S/T or fever; no dizziness or difficulty breathing. No other covid symptoms. No known exposure to covid but pt travels to [place], [place] and [place] daily. Pt is traveling in truck today and has a flip phone so pt scheduled a phone visit 01/30/19 at 3 PM with R Baity NP. [facility].FYI to Avie Echevaria NP.

## 2019-01-30 NOTE — Telephone Encounter (Signed)
Will discuss at upcoming appt.

## 2019-02-09 ENCOUNTER — Other Ambulatory Visit: Payer: Self-pay | Admitting: Family Medicine

## 2019-02-09 NOTE — Telephone Encounter (Signed)
Last office visit 01/30/2019 with R. Baity for sinusitis.  Last refilled 01/19/2019 for #30 with 1 refill.  No future appointments.

## 2019-02-14 ENCOUNTER — Ambulatory Visit (INDEPENDENT_AMBULATORY_CARE_PROVIDER_SITE_OTHER): Payer: BC Managed Care – PPO | Admitting: Psychology

## 2019-02-14 DIAGNOSIS — F418 Other specified anxiety disorders: Secondary | ICD-10-CM | POA: Diagnosis not present

## 2019-02-19 ENCOUNTER — Other Ambulatory Visit: Payer: Self-pay | Admitting: Family Medicine

## 2019-03-02 ENCOUNTER — Ambulatory Visit (INDEPENDENT_AMBULATORY_CARE_PROVIDER_SITE_OTHER): Payer: BC Managed Care – PPO | Admitting: Psychology

## 2019-03-02 DIAGNOSIS — F418 Other specified anxiety disorders: Secondary | ICD-10-CM

## 2019-03-26 ENCOUNTER — Other Ambulatory Visit: Payer: Self-pay | Admitting: Family Medicine

## 2019-03-27 NOTE — Telephone Encounter (Signed)
Last office visit 01/30/2019 with R. Baity for sinusitis.  Last refilled 02/09/2019 for #60 with no refills.  No future appointments.

## 2019-04-28 ENCOUNTER — Other Ambulatory Visit: Payer: Self-pay | Admitting: Family Medicine

## 2019-04-30 NOTE — Telephone Encounter (Signed)
Last office visit 01/30/2019 with R. Baity for sinusitis.  Last refilled 03/27/2019 for #60 with no refills.  No future appointments.

## 2019-05-11 ENCOUNTER — Other Ambulatory Visit: Payer: Self-pay | Admitting: Family Medicine

## 2019-05-14 ENCOUNTER — Other Ambulatory Visit: Payer: Self-pay | Admitting: Family Medicine

## 2019-06-06 ENCOUNTER — Emergency Department (HOSPITAL_COMMUNITY): Payer: BC Managed Care – PPO

## 2019-06-06 ENCOUNTER — Emergency Department (HOSPITAL_COMMUNITY)
Admission: EM | Admit: 2019-06-06 | Discharge: 2019-06-06 | Disposition: A | Discharge status: Home / Self Care | Payer: BC Managed Care – PPO | Attending: Emergency Medicine | Admitting: Emergency Medicine

## 2019-06-06 ENCOUNTER — Ambulatory Visit: Payer: Self-pay

## 2019-06-06 ENCOUNTER — Other Ambulatory Visit: Payer: Self-pay

## 2019-06-06 ENCOUNTER — Encounter (HOSPITAL_COMMUNITY): Payer: Self-pay | Admitting: *Deleted

## 2019-06-06 DIAGNOSIS — R0902 Hypoxemia: Secondary | ICD-10-CM | POA: Diagnosis not present

## 2019-06-06 DIAGNOSIS — B349 Viral infection, unspecified: Secondary | ICD-10-CM | POA: Diagnosis not present

## 2019-06-06 DIAGNOSIS — Z20828 Contact with and (suspected) exposure to other viral communicable diseases: Secondary | ICD-10-CM | POA: Insufficient documentation

## 2019-06-06 DIAGNOSIS — R509 Fever, unspecified: Secondary | ICD-10-CM | POA: Diagnosis present

## 2019-06-06 DIAGNOSIS — Z20822 Contact with and (suspected) exposure to covid-19: Secondary | ICD-10-CM

## 2019-06-06 DIAGNOSIS — Z87891 Personal history of nicotine dependence: Secondary | ICD-10-CM | POA: Diagnosis not present

## 2019-06-06 DIAGNOSIS — Z79899 Other long term (current) drug therapy: Secondary | ICD-10-CM | POA: Diagnosis not present

## 2019-06-06 DIAGNOSIS — M7918 Myalgia, other site: Secondary | ICD-10-CM | POA: Diagnosis not present

## 2019-06-06 DIAGNOSIS — R0602 Shortness of breath: Secondary | ICD-10-CM | POA: Insufficient documentation

## 2019-06-06 DIAGNOSIS — U071 COVID-19: Secondary | ICD-10-CM | POA: Diagnosis not present

## 2019-06-06 DIAGNOSIS — R05 Cough: Secondary | ICD-10-CM | POA: Diagnosis not present

## 2019-06-06 LAB — BASIC METABOLIC PANEL
Anion gap: 15 (ref 5–15)
BUN: 9 mg/dL (ref 6–20)
CO2: 20 mmol/L — ABNORMAL LOW (ref 22–32)
Calcium: 8.8 mg/dL — ABNORMAL LOW (ref 8.9–10.3)
Chloride: 97 mmol/L — ABNORMAL LOW (ref 98–111)
Creatinine, Ser: 1.03 mg/dL (ref 0.61–1.24)
GFR calc Af Amer: >60 mL/min (ref >60)
GFR calc non Af Amer: >60 mL/min (ref >60)
Glucose, Bld: 118 mg/dL — ABNORMAL HIGH (ref 70–99)
Potassium: 3.6 mmol/L (ref 3.5–5.1)
Sodium: 132 mmol/L — ABNORMAL LOW (ref 135–145)

## 2019-06-06 LAB — CBC WITH DIFFERENTIAL/PLATELET
Abs Immature Granulocytes: 0.02 10*3/uL (ref 0.00–0.07)
Basophils Absolute: 0 10*3/uL (ref 0.0–0.1)
Basophils Relative: 0 %
Eosinophils Absolute: 0 10*3/uL (ref 0.0–0.5)
Eosinophils Relative: 0 %
HCT: 42 % (ref 39.0–52.0)
Hemoglobin: 14 g/dL (ref 13.0–17.0)
Immature Granulocytes: 0 %
Lymphocytes Relative: 8 %
Lymphs Abs: 0.6 10*3/uL — ABNORMAL LOW (ref 0.7–4.0)
MCH: 28.7 pg (ref 26.0–34.0)
MCHC: 33.3 g/dL (ref 30.0–36.0)
MCV: 86.2 fL (ref 80.0–100.0)
Monocytes Absolute: 0.7 10*3/uL (ref 0.1–1.0)
Monocytes Relative: 8 %
Neutro Abs: 6.8 10*3/uL (ref 1.7–7.7)
Neutrophils Relative %: 84 %
Platelets: 212 10*3/uL (ref 150–400)
RBC: 4.87 MIL/uL (ref 4.22–5.81)
RDW: 13 % (ref 11.5–15.5)
WBC: 8.1 10*3/uL (ref 4.0–10.5)
nRBC: 0 % (ref 0.0–0.2)

## 2019-06-06 LAB — RESPIRATORY PANEL BY RT PCR (FLU A&B, COVID)
Influenza A by PCR: NEGATIVE
Influenza B by PCR: NEGATIVE
SARS Coronavirus 2 by RT PCR: NEGATIVE

## 2019-06-06 LAB — D-DIMER, QUANTITATIVE: D-Dimer, Quant: 0.81 ug/mL-FEU — ABNORMAL HIGH (ref 0.00–0.50)

## 2019-06-06 LAB — LACTIC ACID, PLASMA
Lactic Acid, Venous: 0.9 mmol/L (ref 0.5–1.9)
Lactic Acid, Venous: 0.7 mmol/L (ref 0.5–1.9)

## 2019-06-06 MED ORDER — IOHEXOL 350 MG/ML SOLN
100.0000 mL | Freq: Once | INTRAVENOUS | Status: AC | PRN
  Administered 2019-06-06: 100 mL via INTRAVENOUS

## 2019-06-06 MED ORDER — ACETAMINOPHEN 325 MG PO TABS
650.0000 mg | ORAL_TABLET | Freq: Once | ORAL | Status: AC
  Administered 2019-06-06: 650 mg via ORAL
  Filled 2019-06-06: qty 2

## 2019-06-06 MED ORDER — ACETAMINOPHEN 325 MG PO TABS
650.0000 mg | ORAL_TABLET | Freq: Once | ORAL | Status: AC | PRN
  Administered 2019-06-06: 16:00:00 650 mg via ORAL
  Filled 2019-06-06: qty 2

## 2019-06-06 MED ORDER — IBUPROFEN 400 MG PO TABS
600.0000 mg | ORAL_TABLET | Freq: Once | ORAL | Status: AC
  Administered 2019-06-06: 20:00:00 600 mg via ORAL
  Filled 2019-06-06: qty 2

## 2019-06-06 MED ORDER — SODIUM CHLORIDE 0.9 % IV BOLUS
1000.0000 mL | Freq: Once | INTRAVENOUS | Status: AC
  Administered 2019-06-06: 1000 mL via INTRAVENOUS

## 2019-06-06 NOTE — ED Provider Notes (Signed)
[facility] EMERGENCY DEPARTMENT Provider Note   CSN: [id number] Arrival date & time: 06/06/19  1434     History   Chief Complaint Chief Complaint  Patient presents with   Fever   Generalized Body Aches    HPI KYEN TAITE is a 38 y.o. male who presents to the ED today with gradual onset, constant, achy, frontal headache x 3 days.  Patient also complains of dry cough, scratchy throat, fevers with T-max 101, chills, dyspnea on exertion, diffuse abdominal pain, looser stools.  No known recent COVID-19 positive exposure.  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  He has been taking ibuprofen and Tylenol with mild relief of his headache.  Patient first assumed that he was having a sinus infection but states that this normally resolves after taking Claritin-D.  He has been taking this as well without relief.  Denies neck stiffness, rash, vision changes, difficulties, unilateral weakness or numbness, gait difficulty, drooling, trismus, inability to swallow, any other associated symptoms.       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    Patient Active Problem List   Diagnosis Date Noted   Obesity (BMI 30.0-34.9) 03/04/2017   Lateral epicondylitis of right elbow 02/11/2017   Chronic radicular low back pain 07/29/2014   Allergic rhinitis 01/06/2010   Depression, major, recurrent, moderate (HCC) 09/25/2007    Past Surgical History:  Procedure Laterality Date   APPENDECTOMY     CARPAL TUNNEL RELEASE Left 07/27/2013   Procedure: LEFT CARPAL TUNNEL RELEASE;  Surgeon: Tami Ribas, MD;  Location: [facility];  Service: Orthopedics;  Laterality: Left;   CARPAL TUNNEL RELEASE Right 09/07/2013   Procedure: RIGHT CARPAL TUNNEL RELEASE;  Surgeon: Tami Ribas, MD;  Location: [facility];  Service: Orthopedics;  Laterality: Right;    LAPAROSCOPIC APPENDECTOMY N/A 07/21/2017   Procedure: APPENDECTOMY LAPAROSCOPIC;  Surgeon: Manus Rudd, MD;  Location: [facility] OR;  Service: General;  Laterality: N/A;   TONSILLECTOMY     VASECTOMY     WISDOM TOOTH EXTRACTION          Home Medications    Prior to Admission medications   Medication Sig Start Date End Date Taking? Authorizing Provider  acetaminophen (TYLENOL) 325 MG tablet You can take 2 tablets every 4 hours as needed for pain.  You can alternate with Ibuprofen, or Oxycodone.  Do not exceed 4000 mg per day. Patient taking differently: You can take 2 tablets every 4 hours as needed for pain. 07/22/17   Sherrie George, PA-C  Aspirin-Salicylamide-Caffeine [facility] HEADACHE) [phone] MG TABS Take 2 each by mouth as needed.     [provider]  buPROPion (WELLBUTRIN XL) 300 MG 24 hr tablet TAKE 1 TABLET BY MOUTH EVERYDAY AT BEDTIME 05/14/19   Bedsole, Amy E, MD  doxycycline (VIBRA-TABS) 100 MG tablet Take 1 tablet (100 mg total) by mouth 2 (two) times daily. 01/30/19   Lorre Munroe, NP  fluticasone (FLONASE) 50 MCG/ACT nasal spray SPRAY 2 SPRAYS INTO EACH NOSTRIL EVERY DAY 02/19/19   Bedsole, Amy E, MD  ibuprofen (ADVIL,MOTRIN) 200 MG tablet You can take 2-3 tablets every 6 hours as needed for pain.  You can alternate with Tylenol(acetaminophen) or the oxycodone.  I would not exceed this amount.  Ibuprofen can hurt your kidneys, and cause an ulcer.    You  can buy this over the counter at any drug store. Patient not taking: Reported on 09/08/2018 07/22/17   Sherrie GeorgeJennings, Willard, PA-C  sertraline (ZOLOFT) 50 MG tablet Take 1 tablet (50 mg total) by mouth daily. NEEDS OFFICE VISIT 05/11/19   Excell SeltzerBedsole, Amy E, MD  traMADol (ULTRAM) 50 MG tablet TAKE 1 TABLET (50 MG TOTAL) BY MOUTH EVERY 12 (TWELVE) HOURS AS NEEDED. 04/30/19   Excell SeltzerBedsole, Amy E, MD    Family History Family History  Problem Relation Age of Onset   Depression Mother    Deep vein thrombosis Father    Heart attack  Maternal Grandmother    Diabetes Paternal Grandmother    Cancer Paternal Grandfather        PROSTATE    Social History Social History   Tobacco Use   Smoking status: Former Smoker   Smokeless tobacco: Former NeurosurgeonUser   Tobacco comment: quit smoking 6 years ago  Substance Use Topics   Alcohol use: No    Alcohol/week: 0.0 standard drinks   Drug use: No     Allergies   Penicillins and Sulfonamide derivatives   Review of Systems Review of Systems  Constitutional: Positive for chills, fatigue and fever.  HENT: Positive for sore throat. Negative for congestion, ear pain, trouble swallowing and voice change.   Eyes: Negative for visual disturbance.  Respiratory: Positive for cough and shortness of breath.   Cardiovascular: Negative for chest pain.  Gastrointestinal: Positive for abdominal pain, diarrhea and nausea. Negative for vomiting.  Genitourinary: Negative for difficulty urinating.  Musculoskeletal: Positive for myalgias. Negative for neck pain and neck stiffness.  Skin: Negative for rash.  Neurological: Positive for headaches. Negative for speech difficulty, weakness and numbness.     Physical Exam Updated Vital Signs BP (!) 140/92 (BP Location: Right Arm)    Pulse (!) 116    Temp (!) 101 F (38.3 C) (Oral)    Resp 18    Ht 5\' 7"  (1.702 m)    Wt 95.3 kg    SpO2 100%    BMI 32.89 kg/m   Physical Exam Vitals signs and nursing note reviewed.  Constitutional:      Appearance: He is not ill-appearing or diaphoretic.  HENT:     Head: Normocephalic and atraumatic.     Right Ear: Tympanic membrane normal.     Left Ear: Tympanic membrane normal.     Mouth/Throat:     Mouth: Mucous membranes are dry.  Eyes:     Extraocular Movements: Extraocular movements intact.     Conjunctiva/sclera: Conjunctivae normal.     Pupils: Pupils are equal, round, and reactive to light.  Neck:     Musculoskeletal: Neck supple. No neck rigidity.     Meningeal: Brudzinski's sign and  Kernig's sign absent.  Cardiovascular:     Rate and Rhythm: Regular rhythm. Tachycardia present.     Pulses: Normal pulses.  Pulmonary:     Effort: Pulmonary effort is normal.     Breath sounds: Normal breath sounds. No wheezing, rhonchi or rales.  Chest:     Chest wall: No tenderness.  Abdominal:     Palpations: Abdomen is soft.     Tenderness: There is no abdominal tenderness. There is no guarding or rebound.  Musculoskeletal:     Right lower leg: No edema.     Left lower leg: No edema.  Skin:    General: Skin is warm and dry.     Findings: No rash.  Neurological:  Mental Status: He is alert.     Comments: CN 3-12 grossly intact A&O x4 GCS 15 Sensation and strength intact Gait nonataxic including with tandem walking Coordination with finger-to-nose WNL Neg romberg, neg pronator drift      ED Treatments / Results  Labs (all labs ordered are listed, but only abnormal results are displayed) Labs Reviewed  D-DIMER, QUANTITATIVE (NOT AT [facility]) - Abnormal; Notable for the following components:      Result Value   D-Dimer, Quant 0.81 (*)    All other components within normal limits  BASIC METABOLIC PANEL - Abnormal; Notable for the following components:   Sodium 132 (*)    Chloride 97 (*)    CO2 20 (*)    Glucose, Bld 118 (*)    Calcium 8.8 (*)    All other components within normal limits  CBC WITH DIFFERENTIAL/PLATELET - Abnormal; Notable for the following components:   Lymphs Abs 0.6 (*)    All other components within normal limits  RESPIRATORY PANEL BY RT PCR (FLU A&B, COVID)  NOVEL CORONAVIRUS, NAA (HOSP ORDER, SEND-OUT TO REF LAB; TAT 18-24 HRS)  LACTIC ACID, PLASMA  LACTIC ACID, PLASMA    EKG None  ED ECG REPORT   Date: 06/06/2019  Rate: 97  Rhythm: normal sinus rhythm  QRS Axis: normal  Intervals: normal  ST/T Wave abnormalities: normal  Conduction Disutrbances:right bundle branch block  Narrative Interpretation:   Old EKG Reviewed: none  available  Radiology Ct Angio Chest Pe W/cm &/or Wo Cm  Result Date: 06/06/2019 CLINICAL DATA:  PE suspected. Positive D-dimer. Shortness of breath. EXAM: CT ANGIOGRAPHY CHEST WITH CONTRAST TECHNIQUE: Multidetector CT imaging of the chest was performed using the standard protocol during bolus administration of intravenous contrast. Multiplanar CT image reconstructions and MIPs were obtained to evaluate the vascular anatomy. CONTRAST:  OMNIPAQUE IOHEXOL 350 MG/ML SOLN COMPARISON:  None. FINDINGS: Cardiovascular: Evaluation for pulmonary emboli is limited by respiratory motion artifact.Given this limitation, no pulmonary embolism was detected. The main pulmonary artery is within normal limits for size. There is no CT evidence of acute right heart strain. The visualized aorta is normal. Heart size is mildly enlarged. There is no significant pericardial effusion. Mediastinum/Nodes: --No mediastinal or hilar lymphadenopathy. --No axillary lymphadenopathy. --No supraclavicular lymphadenopathy. --Normal thyroid gland. --The esophagus is unremarkable Lungs/Pleura: No pulmonary nodules or masses. No pleural effusion or pneumothorax. No focal airspace consolidation. No focal pleural abnormality. Upper Abdomen: No acute abnormality. Musculoskeletal: No chest wall abnormality. No acute or significant osseous findings. Review of the MIP images confirms the above findings. IMPRESSION: 1. Evaluation for pulmonary emboli is limited by respiratory motion artifact. Given this limitation, no pulmonary embolism was detected. 2. Mild cardiomegaly. 3. No acute cardiopulmonary process identified. Electronically Signed   By: Katherine Mantle M.D.   On: 06/06/2019 19:42   Dg Chest Port 1 View  Result Date: 06/06/2019 CLINICAL DATA:  Cough and congestion. EXAM: PORTABLE CHEST 1 VIEW COMPARISON:  None. FINDINGS: The heart size and mediastinal contours are within normal limits. Both lungs are clear. The visualized skeletal  structures are unremarkable. IMPRESSION: Normal chest x-ray. Electronically Signed   By: Rudie Meyer M.D.   On: 06/06/2019 16:43    Procedures Procedures (including critical care time)  Medications Ordered in ED Medications  acetaminophen (TYLENOL) tablet 650 mg (650 mg Oral Given 06/06/19 1536)  sodium chloride 0.9 % bolus 1,000 mL (0 mLs Intravenous Stopped 06/06/19 1916)  iohexol (OMNIPAQUE) 350 MG/ML injection 100 mL (  100 mLs Intravenous Contrast Given 06/06/19 1919)  ibuprofen (ADVIL) tablet 600 mg (600 mg Oral Given 06/06/19 1949)  acetaminophen (TYLENOL) tablet 650 mg (650 mg Oral Given 06/06/19 2022)     Initial Impression / Assessment and Plan / ED Course  I have reviewed the triage vital signs and the nursing notes.  Pertinent labs & imaging results that were available during my care of the patient were reviewed by me and considered in my medical decision making (see chart for details).  Clinical Course as of Jun 05 2044  Wed Jun 06, 2019  1754 D-Dimer, Quant(!): 0.81 [MV]    Clinical Course User Index [MV] Eustaquio Maize, [place]   38 year old male presents to the ED today complaining of headache, cough, shortness of breath, dry throat, abdominal pain, nausea, diarrhea past 3 days.  No known COVID-19 positive exposure.  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  He reports that individuals do not always wear masks when they are in his company.  On arrival patient's temp 101.  He was not aware that he had fevers prior to coming to the ED but had had chills.  Tachycardic to 116.  Patient able to speak in full sentences without difficulty.  No accessory muscle use.  Satting 100% on room air.  On likelihood the patient has Covid 19 today.  He did not receive his flu shot vaccine this year.  Will swab for both including respiratory panel.  Given tachycardia will give a liter of fluids.  Tylenol given for fever.  Will check basic blood work including BMP and  CBC.  D-dimer added given tachycardia and risk factors including prolonged travel.  Chest x-ray obtained due to coughing.  Will reevaluate once labs and imaging return.   EKG without ischemic changes, fortunately unable to auto populated in chart.  See attending physician's separate ED note.  X-ray clear.  CBC without leukocytosis.  BMP with slight decrease in sodium at 132, chloride 97, CO2 20, glucose 118.  Receiving fluids at this time.  Creatinine 1.03.  D-dimer elevated at 0.81; obtain CTA.  Rapid Covid negative.  Negative for flu.  Will send out send out Covid test.  Lactic acid within normal limits.   Received Tylenol on arrival to the ED with temp 101.  Patient's overture has continued to increase during his ED visit with highest 103.1.  Given Tylenol approximately 15 minutes ago with recheck 98.6.   CTA negative.  he has been ambulated the ED with oxygen saturation remaining above 95%.  Advised to self isolate until he receives his Covid results.  Will be given a work note.  Advised to take Tylenol every 4 to 6 hours for fever and body aches.  Advised to follow-up with PCP.  If positive patient advised to self isolate for 14 days starting today.  He is in agreement with plan at this time and stable for discharge home.   CHRISTOPHER GLASSCOCK was evaluated in Emergency Department on 06/06/2019 for the symptoms described in the history of present illness. He was evaluated in the context of the global COVID-19 pandemic, which necessitated consideration that the patient might be at risk for infection with the SARS-CoV-2 virus that causes COVID-19. Institutional protocols and algorithms that pertain to the evaluation of patients at risk for COVID-19 are in a state of rapid change based on information released by regulatory bodies including the CDC and federal and state organizations. These policies and algorithms  were followed during the patient's care in the ED.  This note was prepared using Dragon voice  recognition software and may include unintentional dictation errors due to the inherent limitations of voice recognition software.       Final Clinical Impressions(s) / ED Diagnoses   Final diagnoses:  Viral illness  Person under investigation for COVID-19    ED Discharge Orders    None       Tanda Rockers, PA-C 06/06/19 2049    Vanetta Mulders, MD 06/07/19 [phone]

## 2019-06-06 NOTE — ED Notes (Signed)
Ambulated 02 sats 95% while ambulating heart rate in 120s. Patient only complaint was "splitting headache"

## 2019-06-06 NOTE — Telephone Encounter (Signed)
Call received from patient who states that he has body aches HA and SOB. 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 His symptoms started 2 days ago with HA and nasal congestion. He has taken OTC medication for his sinus issues  He has not been able to rest He feels SOB  States it feels like when you have been running and you just can't catch your breath. He states he is drinking a lot of waster but when he urinates he feels burning and like he is not empty. He has made many trip to the bathroom today. He states his urine is clear yellow. Per protocol patient will go to ER for evaluation. Patient preference is [facility]. Rachael was notified that patient will arrive by private car and has possible COVID-19 symptoms. Care advice was read to patient. He verbalized understanding.  Reason for Disposition . MILD difficulty breathing (e.g., minimal/no SOB at rest, SOB with walking, pulse <100)  Answer Assessment - Initial Assessment Questions 1. 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 COVID-19 EXPOSURE: "Was there any known exposure to COVID before the symptoms began?" CDC Definition of close contact: within 6 feet (2 meters) for a total of 15 minutes or more over a 24-hour period.      unsure 3. ONSET: "When did the COVID-19 symptoms start?"      2 days ago with HA 4. WORST SYMPTOM: "What is your worst symptom?" (e.g., cough, fever, shortness of breath, muscle aches)     HA,tired, back pain, cough, difficult urinating 5. COUGH: "Do you have a cough?" If so, ask: "How bad is the cough?"       mild 6. FEVER: "Do you have a fever?" If so, ask: "What is your  temperature, how was it measured, and when did it start?"    97 7. RESPIRATORY STATUS: "Describe your breathing?" (e.g., shortness of breath, wheezing, unable to speak)     sob 8. BETTER-SAME-WORSE: "Are you getting better, staying the same or getting worse compared to yesterday?"  If getting worse, ask, "In what way?"     worse 9. HIGH RISK DISEASE: "Do you have any chronic medical problems?" (e.g., asthma, heart or lung disease, weak immune system, obesity, etc.)    no 10. PREGNANCY: "Is there any chance you are pregnant?" "When was your last menstrual period?"      N/A 11. OTHER SYMPTOMS: "Do you have any other symptoms?"  (e.g., chills, fatigue, headache, loss of smell or taste, muscle pain, sore throat; new loss of smell or taste especially support the diagnosis of COVID-19)      Headache fatigue, body aches, no  Protocols used: CORONAVIRUS (COVID-19) DIAGNOSED OR SUSPECTED-A-AH

## 2019-06-06 NOTE — Discharge Instructions (Addendum)
Please stay home and self isolate until you receive your COVID 19 test results (can take up to 2-3 days to return). If negative you may return to work when you are fever free for > 72 hours without fever reducing medications. If positive you will need to self isolate for 14 days starting today (cleared: 06/21/2019). Follow up with your PCP regarding your ED visit today. Increase your fluid intake to stay hydrated.      Person Under Monitoring Name: Christopher Hatfield  Location: [place] [address]   Infection Prevention Recommendations for Individuals Confirmed to have, or Being Evaluated for, 2019 Novel Coronavirus (COVID-19) Infection Who Receive Care at Home  Individuals who are confirmed to have, or are being evaluated for, COVID-19 should follow the prevention steps below until a healthcare provider or local or state health department says they can return to normal activities.  Stay home except to get medical care You should restrict activities outside your home, except for getting medical care. Do not go to work, school, or public areas, and do not use public transportation or taxis.  Call ahead before visiting your doctor Before your medical appointment, call the healthcare provider and tell them that you have, or are being evaluated for, COVID-19 infection. This will help the healthcare providers office take steps to keep other people from getting infected. Ask your healthcare provider to call the local or state health department.  Monitor your symptoms Seek prompt medical attention if your illness is worsening (e.g., difficulty breathing). Before going to your medical appointment, call the healthcare provider and tell them that you have, or are being evaluated for, COVID-19 infection. Ask your healthcare provider to call the local or state health department.  Wear a facemask You should wear a facemask that covers your nose and mouth when you are in the  same room with other people and when you visit a healthcare provider. People who live with or visit you should also wear a facemask while they are in the same room with you.  Separate yourself from other people in your home As much as possible, you should stay in a different room from other people in your home. Also, you should use a separate bathroom, if available.  Avoid sharing household items You should not share dishes, drinking glasses, cups, eating utensils, towels, bedding, or other items with other people in your home. After using these items, you should wash them thoroughly with soap and water.  Cover your coughs and sneezes Cover your mouth and nose with a tissue when you cough or sneeze, or you can cough or sneeze into your sleeve. Throw used tissues in a lined trash can, and immediately wash your hands with soap and water for at least 20 seconds or use an alcohol-based hand rub.  Wash your [facility] your hands often and thoroughly with soap and water for at least 20 seconds. You can use an alcohol-based hand sanitizer if soap and water are not available and if your hands are not visibly dirty. Avoid touching your eyes, nose, and mouth with unwashed hands.   Prevention Steps for Caregivers and Household Members of Individuals Confirmed to have, or Being Evaluated for, COVID-19 Infection Being Cared for in the Home  If you live with, or provide care at home for, a person confirmed to have, or being evaluated for, COVID-19 infection please follow these guidelines to prevent infection:  Follow healthcare providers instructions Make sure that you understand and  can help the patient follow any healthcare provider instructions for all care.  Provide for the patients basic needs You should help the patient with basic needs in the home and provide support for getting groceries, prescriptions, and other personal needs.  Monitor the patients symptoms If they are getting  sicker, call his or her medical provider and tell them that the patient has, or is being evaluated for, COVID-19 infection. This will help the healthcare providers office take steps to keep other people from getting infected. Ask the healthcare provider to call the local or state health department.  Limit the number of people who have contact with the patient If possible, have only one caregiver for the patient. Other household members should stay in another home or place of residence. If this is not possible, they should stay in another room, or be separated from the patient as much as possible. Use a separate bathroom, if available. Restrict visitors who do not have an essential need to be in the home.  Keep older adults, very young children, and other sick people away from the patient Keep older adults, very young children, and those who have compromised immune systems or chronic health conditions away from the patient. This includes people with chronic heart, lung, or kidney conditions, diabetes, and cancer.  Ensure good ventilation Make sure that shared spaces in the home have good air flow, such as from an air conditioner or an opened window, weather permitting.  Wash your hands often Wash your hands often and thoroughly with soap and water for at least 20 seconds. You can use an alcohol based hand sanitizer if soap and water are not available and if your hands are not visibly dirty. Avoid touching your eyes, nose, and mouth with unwashed hands. Use disposable paper towels to dry your hands. If not available, use dedicated cloth towels and replace them when they become wet.  Wear a facemask and gloves Wear a disposable facemask at all times in the room and gloves when you touch or have contact with the patients blood, body fluids, and/or secretions or excretions, such as sweat, saliva, sputum, nasal mucus, vomit, urine, or feces.  Ensure the mask fits over your nose and mouth tightly,  and do not touch it during use. Throw out disposable facemasks and gloves after using them. Do not reuse. Wash your hands immediately after removing your facemask and gloves. If your personal clothing becomes contaminated, carefully remove clothing and launder. Wash your hands after handling contaminated clothing. Place all used disposable facemasks, gloves, and other waste in a lined container before disposing them with other household waste. Remove gloves and wash your hands immediately after handling these items.  Do not share dishes, glasses, or other household items with the patient Avoid sharing household items. You should not share dishes, drinking glasses, cups, eating utensils, towels, bedding, or other items with a patient who is confirmed to have, or being evaluated for, COVID-19 infection. After the person uses these items, you should wash them thoroughly with soap and water.  Wash laundry thoroughly Immediately remove and wash clothes or bedding that have blood, body fluids, and/or secretions or excretions, such as sweat, saliva, sputum, nasal mucus, vomit, urine, or feces, on them. Wear gloves when handling laundry from the patient. Read and follow directions on labels of laundry or clothing items and detergent. In general, wash and dry with the warmest temperatures recommended on the label.  Clean all areas the individual has used often Clean all  touchable surfaces, such as counters, tabletops, doorknobs, bathroom fixtures, toilets, phones, keyboards, tablets, and bedside tables, every day. Also, clean any surfaces that may have blood, body fluids, and/or secretions or excretions on them. Wear gloves when cleaning surfaces the patient has come in contact with. Use a diluted bleach solution (e.g., dilute bleach with 1 part bleach and 10 parts water) or a household disinfectant with a label that says EPA-registered for coronaviruses. To make a bleach solution at home, add 1 tablespoon  of bleach to 1 quart (4 cups) of water. For a larger supply, add  cup of bleach to 1 gallon (16 cups) of water. Read labels of cleaning products and follow recommendations provided on product labels. Labels contain instructions for safe and effective use of the cleaning product including precautions you should take when applying the product, such as wearing gloves or eye protection and making sure you have good ventilation during use of the product. Remove gloves and wash hands immediately after cleaning.  Monitor yourself for signs and symptoms of illness Caregivers and household members are considered close contacts, should monitor their health, and will be asked to limit movement outside of the home to the extent possible. Follow the monitoring steps for close contacts listed on the symptom monitoring form.   ? If you have additional questions, contact your local health department or call the epidemiologist on call at [phone] (available 24/7). ? This guidance is subject to change. For the most up-to-date guidance from [facility], please refer to their website: TripMet[employer].huhttps://www.cdc.gov/coronavirus/2019-ncov/hcp/guidance-prevent-spread.html

## 2019-06-06 NOTE — ED Triage Notes (Signed)
Tylenol last taken at 1000 and Ibuprofen at 0200.

## 2019-06-06 NOTE — ED Triage Notes (Signed)
Pt c/o body aches, exhausted, sore throat, SOB when walking, headache, dry cough x few days, worsening this morning. No loss of sense of taste or smell. No Covid exposures that he knows of.

## 2019-06-06 NOTE — ED Provider Notes (Signed)
ED ECG REPORT   Date: 06/06/2019  Rate: 97  Rhythm: normal sinus rhythm  QRS Axis: normal  Intervals: normal  ST/T Wave abnormalities: normal  Conduction Disutrbances:right bundle branch block  Narrative Interpretation:   Old EKG Reviewed: none available  I have personally reviewed the EKG tracing and agree with the computerized printout as noted.    Fredia Sorrow, MD 06/06/19 [phone]

## 2019-06-07 NOTE — Telephone Encounter (Signed)
Noted. Agree with ER eval

## 2019-06-08 LAB — NOVEL CORONAVIRUS, NAA (HOSP ORDER, SEND-OUT TO REF LAB; TAT 18-24 HRS): SARS-CoV-2, NAA: NOT DETECTED

## 2019-06-12 ENCOUNTER — Encounter: Payer: Self-pay | Admitting: *Deleted

## 2019-06-12 ENCOUNTER — Encounter: Payer: Self-pay | Admitting: Family Medicine

## 2019-06-12 ENCOUNTER — Other Ambulatory Visit: Payer: Self-pay

## 2019-06-12 ENCOUNTER — Ambulatory Visit (INDEPENDENT_AMBULATORY_CARE_PROVIDER_SITE_OTHER): Payer: BC Managed Care – PPO | Admitting: Family Medicine

## 2019-06-12 VITALS — Ht 68.0 in

## 2019-06-12 DIAGNOSIS — A084 Viral intestinal infection, unspecified: Secondary | ICD-10-CM | POA: Diagnosis not present

## 2019-06-12 NOTE — Progress Notes (Signed)
VIRTUAL VISIT Due to national recommendations of social distancing due to COVID 19, a virtual visit is felt to be most appropriate for this patient at this time.   I connected with the patient on 06/12/19 at 10:40 AM EST by virtual telehealth platform and verified that I am speaking with the correct person using two identifiers.   Interactive audio and video telecommunications were attempted between this provider and patient, however failed, due to patient having technical difficulties OR patient did not have access to video capability.  We continued and completed visit with audio only.   I discussed the limitations, risks, security and privacy concerns of performing an evaluation and management service by  virtual telehealth platform and the availability of in person appointments. I also discussed with the patient that there may be a patient responsible charge related to this service. The patient expressed understanding and agreed to proceed.  Patient location: Home Provider Location: [place] Participants: Kerby Nora and APOLINAR BERO    History of Present Illness:  38 year old male present via phone for an ED follow up visit.   He was seen on  06/06/2019 with  HA, cough, scratchy throat, fever 101F, DOE, abdominal pain and diarrhea. Felt likely viral illness, likely COVID19  EKG without ischemic changes, fortunately unable to auto populated in chart.  See attending physician's separate ED note.  X-ray clear.  CBC without leukocytosis.  BMP with slight decrease in sodium at 132, chloride 97, CO2 20, glucose 118.  Receiving fluids at this time.  Creatinine 1.03.  D-dimer elevated at 0.81; CTA was neg for PE.  Rapid Covid negative.  Negative for flu.   Lactic acid within normal limits.   Sent out COVID SARS-CoV-2, NAA test: returned negative.  He reports in few days he has been feeling better. Still with increased BMs, continued fatigue.  fever resolved. Abdominal pain.Marland Kitchen  resolved with BMs. No N/E. No blood in stool.  He is trying to keep up with water, drinking propels. Cough improved, no SOB. No wheeze.  Using immodium.    COVID 19 screen No recent travel or known exposure to COVID19 The patient denies respiratory symptoms of COVID 19 at this time.  The importance of social distancing was discussed today.   Review of Systems  Constitutional: Negative for chills and fever.  HENT: Negative for congestion and ear pain.   Eyes: Negative for pain and redness.  Respiratory: Negative for cough and shortness of breath.   Cardiovascular: Negative for chest pain, palpitations and leg swelling.  Gastrointestinal: Positive for abdominal pain and diarrhea. Negative for blood in stool, constipation, nausea and vomiting.  Genitourinary: Negative for dysuria.  Musculoskeletal: Negative for falls and myalgias.  Skin: Negative for rash.  Neurological: Negative for dizziness.  Psychiatric/Behavioral: Negative for depression. The patient is not nervous/anxious.       Past Medical History:  Diagnosis Date  . Carpal tunnel syndrome of right wrist 08/2013    reports that he has quit smoking. He has quit using smokeless tobacco. He reports that he does not drink alcohol or use drugs.   Current Outpatient Medications:  .  acetaminophen (TYLENOL) 325 MG tablet, You can take 2 tablets every 4 hours as needed for pain.  You can alternate with Ibuprofen, or Oxycodone.  Do not exceed 4000 mg per day. (Patient taking differently: You can take 2 tablets every 4 hours as needed for pain.), Disp: , Rfl:  .  Aspirin-Salicylamide-Caffeine (BC HEADACHE) 325-95-16 MG  TABS, Take 2 each by mouth as needed. , Disp: , Rfl:  .  buPROPion (WELLBUTRIN XL) 300 MG 24 hr tablet, TAKE 1 TABLET BY MOUTH EVERYDAY AT BEDTIME, Disp: 90 tablet, Rfl: 0 .  doxycycline (VIBRA-TABS) 100 MG tablet, Take 1 tablet (100 mg total) by mouth 2 (two) times daily., Disp: 20 tablet, Rfl: 0 .  fluticasone  (FLONASE) 50 MCG/ACT nasal spray, SPRAY 2 SPRAYS INTO EACH NOSTRIL EVERY DAY, Disp: 48 mL, Rfl: 1 .  ibuprofen (ADVIL,MOTRIN) 200 MG tablet, You can take 2-3 tablets every 6 hours as needed for pain.  You can alternate with Tylenol(acetaminophen) or the oxycodone.  I would not exceed this amount.  Ibuprofen can hurt your kidneys, and cause an ulcer.    You can buy this over the counter at any drug store. (Patient not taking: Reported on 09/08/2018), Disp: 30 tablet, Rfl: 0 .  sertraline (ZOLOFT) 50 MG tablet, Take 1 tablet (50 mg total) by mouth daily. NEEDS OFFICE VISIT, Disp: 90 tablet, Rfl: 0 .  traMADol (ULTRAM) 50 MG tablet, TAKE 1 TABLET (50 MG TOTAL) BY MOUTH EVERY 12 (TWELVE) HOURS AS NEEDED., Disp: 60 tablet, Rfl: 0   Observations/Objective: Height 5\' 8"  (1.727 m).  Physical Exam  Physical Exam Constitutional:      General: The patient is not in acute distress. Pulmonary:     Effort: Pulmonary effort is normal. No respiratory distress.  Neurological:     Mental Status: The patient is alert and oriented to person, place, and time.  Psychiatric:        Mood and Affect: Mood normal.        Behavior: Behavior normal.   Assessment and Plan Viral gastroenteritis Respiratory symptoms were very shortterm, lated a day.. ? Related to reflux, nausea. Now with resolivng fever and resolving diarrhea.  Reviewed symptomatic care and ER return precautions. Pt liekly infectious until diarrhea resolved.     I discussed the assessment and treatment plan with the patient. The patient was provided an opportunity to ask questions and all were answered. The patient agreed with the plan and demonstrated an understanding of the instructions.   The patient was advised to call back or seek an in-person evaluation if the symptoms worsen or if the condition fails to improve as anticipated.     Eliezer Lofts, MD

## 2019-06-12 NOTE — Progress Notes (Signed)
Work note written as instructed by Dr. Bedsole and sent to patient's MyChart. 

## 2019-06-12 NOTE — Patient Instructions (Signed)
Return your normal diet. Keep up with fluids. Can immodium as need. Rest and time.

## 2019-06-12 NOTE — Assessment & Plan Note (Signed)
Respiratory symptoms were very shortterm, lated a day.. ? Related to reflux, nausea. Now with resolivng fever and resolving diarrhea.  Reviewed symptomatic care and ER return precautions. Pt liekly infectious until diarrhea resolved.

## 2019-06-15 ENCOUNTER — Telehealth: Payer: Self-pay

## 2019-06-15 NOTE — Telephone Encounter (Signed)
Patient had a virtual visit on 06/12/2019 with Dr Diona Browner and states he was suppose to call back if he was not better by Friday. Patient Still having diarrhea, he gets an overwhelming feeling wanting to go to the bathroom but not a lot comes out. He feels like he is constipated when he sits on the toilet but then diarrhea comes out, when he pees he has bowel pressure. Feels like something is pulling in his bowels. Very painful. No fever. No vomiting.

## 2019-06-15 NOTE — Telephone Encounter (Signed)
Mr. Geter notified as instructed by telephone.  Patient states understanding. 

## 2019-06-15 NOTE — Telephone Encounter (Signed)
If not improving patient should be seen at urgent care.

## 2019-07-12 ENCOUNTER — Other Ambulatory Visit: Payer: Self-pay | Admitting: Family Medicine

## 2019-07-12 NOTE — Telephone Encounter (Signed)
Last office visit [phone] for viral gastroenteritis.  Last refilled 11/0/2020 for #60 with no refills.  No future appointments.

## 2019-08-03 ENCOUNTER — Other Ambulatory Visit: Payer: Self-pay | Admitting: Family Medicine

## 2019-08-03 NOTE — Telephone Encounter (Signed)
Patient needs to schedule CPE with labs prior when possible then will review refill request. Thank you

## 2019-08-07 ENCOUNTER — Encounter: Payer: Self-pay | Admitting: Family Medicine

## 2019-08-07 ENCOUNTER — Ambulatory Visit (INDEPENDENT_AMBULATORY_CARE_PROVIDER_SITE_OTHER): Payer: BC Managed Care – PPO | Admitting: Family Medicine

## 2019-08-07 ENCOUNTER — Other Ambulatory Visit: Payer: Self-pay

## 2019-08-07 VITALS — Ht 68.0 in

## 2019-08-07 DIAGNOSIS — J309 Allergic rhinitis, unspecified: Secondary | ICD-10-CM

## 2019-08-07 DIAGNOSIS — F331 Major depressive disorder, recurrent, moderate: Secondary | ICD-10-CM | POA: Diagnosis not present

## 2019-08-07 DIAGNOSIS — R41840 Attention and concentration deficit: Secondary | ICD-10-CM | POA: Diagnosis not present

## 2019-08-07 MED ORDER — DULOXETINE HCL 30 MG PO CPEP: 30.0000 mg | ORAL_CAPSULE | Freq: Every day | ORAL | 3 refills | Status: DC

## 2019-08-07 MED ORDER — PREDNISONE 20 MG PO TABS: ORAL_TABLET | ORAL | 0 refills | Status: DC

## 2019-08-07 NOTE — Assessment & Plan Note (Signed)
Poor control. Continue wellbutrin but change sertraline to cymbalta ( for added pain control benefit given chronic pain).  Close follow up in 2 weeks.. may need increase in dose at that time if tolerating.

## 2019-08-07 NOTE — Telephone Encounter (Signed)
Appointment 2/9 to discuss meds

## 2019-08-07 NOTE — Patient Instructions (Addendum)
Continue bupropion 300 mg daily.  Stop sertraline.  Start Cymbalta 30 mg daily.   Change to Xyzal at bedtime.  Hold flonase..  Can use nasal saline spray or irrigation.  Complete a  taper of prednisone.  Go to ER if severe shortness of breath. Consider COVID testing if not improving as expected.

## 2019-08-07 NOTE — Assessment & Plan Note (Signed)
Possibly this issue  due to depression and mood issues. If not improving once mood improved.. consider referral for ADD testing.

## 2019-08-07 NOTE — Progress Notes (Signed)
VIRTUAL VISIT Due to national recommendations of social distancing due to COVID 19, a virtual visit is felt to be most appropriate for this patient at this time.   I connected with the patient on 08/07/19 at  8:20 AM EST by virtual telehealth platform and verified that I am speaking with the correct person using two identifiers.   I discussed the limitations, risks, security and privacy concerns of performing an evaluation and management service by  virtual telehealth platform and the availability of in person appointments. I also discussed with the patient that there may be a patient responsible charge related to this service. The patient expressed understanding and agreed to proceed.  Patient location: Home Provider Location: [place] Participants: Kerby Nora and Doristine Section   Chief Complaint  Patient presents with  . Depression    Discuss increasing medication dose  . Sinusitis    History of Present Illness:  39 year old male presents with worsening depression.  1. Depression: increase irritability in last several weeks. He feels down, depressed, has anhedonia, decreased motivation. He is tired, usually sleeps well,trouble concentrating.  He is more anxious lately, no panic attacks. Attacks of anger,freustratuion overwhelming.  NO danger to self or other. Takes deep breaths, gets away.   Bupropion 300 and sertraline 50. Depression screen [facility] 2/9 08/07/2019 09/08/2018 08/01/2018 06/30/2018 03/04/2017  Decreased Interest 3 1 3 3 1   Down, Depressed, Hopeless 3 1 2 2 1   PHQ - 2 Score 6 2 5 5 2   Altered sleeping 0 0 1 0 0  Tired, decreased energy 3 0 - 1 2  Change in appetite 2 0 1 0 1  Feeling bad or failure about yourself  0 0 0 1 1  Trouble concentrating 3 0 2 3 1   Moving slowly or fidgety/restless 0 0 0 0 0  Suicidal thoughts 0 0 0 0 0  PHQ-9 Score 14 2 9 10 7   Difficult doing work/chores Very difficult Not difficult at all Somewhat difficult Somewhat difficult  Somewhat difficult  Some recent data might be hidden   He feels taking with counselor in past... learned some techniques but cannot afford.   2. Sinus problem: 1-2 month of post nasal drip. Swallowing mucus.   No face pain, no ear pain, occ headache. Using floanse 2 sprays per nostril. No congestion., occ runny nose.  no cough, no SOB, no ST.  No fever.  Occ sneeze.  COVID 19 screen No recent travel or known exposure to COVID19 The patient denies respiratory symptoms of COVID 19 at this time.  The importance of social distancing was discussed today.   Review of Systems  Constitutional: Negative for chills and fever.  HENT: Negative for congestion, ear pain and sinus pain.   Eyes: Negative for pain and redness.  Respiratory: Negative for cough and shortness of breath.   Cardiovascular: Negative for chest pain, palpitations and leg swelling.  Gastrointestinal: Negative for abdominal pain, blood in stool, constipation, diarrhea, nausea and vomiting.  Genitourinary: Negative for dysuria.  Musculoskeletal: Negative for falls and myalgias.  Skin: Negative for rash.  Neurological: Negative for dizziness.  Psychiatric/Behavioral: Negative for depression. The patient is not nervous/anxious.       Past Medical History:  Diagnosis Date  . Carpal tunnel syndrome of right wrist 08/2013    reports that he has quit smoking. He has quit using smokeless tobacco. He reports that he does not drink alcohol or use drugs.   Current Outpatient Medications:  .  acetaminophen (TYLENOL) 325 MG tablet, You can take 2 tablets every 4 hours as needed for pain.  You can alternate with Ibuprofen, or Oxycodone.  Do not exceed 4000 mg per day., Disp: , Rfl:  .  Aspirin-Salicylamide-Caffeine (BC HEADACHE) 325-95-16 MG TABS, Take 2 each by mouth as needed. , Disp: , Rfl:  .  buPROPion (WELLBUTRIN XL) 300 MG 24 hr tablet, TAKE 1 TABLET BY MOUTH EVERYDAY AT BEDTIME, Disp: 90 tablet, Rfl: 0 .  doxycycline  (VIBRA-TABS) 100 MG tablet, Take 1 tablet (100 mg total) by mouth 2 (two) times daily., Disp: 20 tablet, Rfl: 0 .  fluticasone (FLONASE) 50 MCG/ACT nasal spray, SPRAY 2 SPRAYS INTO EACH NOSTRIL EVERY DAY, Disp: 48 mL, Rfl: 1 .  sertraline (ZOLOFT) 50 MG tablet, Take 1 tablet (50 mg total) by mouth daily. NEEDS OFFICE VISIT, Disp: 90 tablet, Rfl: 0 .  traMADol (ULTRAM) 50 MG tablet, TAKE 1 TABLET (50 MG TOTAL) BY MOUTH EVERY 12 (TWELVE) HOURS AS NEEDED., Disp: 60 tablet, Rfl: 0   Observations/Objective: Height 5\' 8"  (1.727 m).  Physical Exam  Physical Exam Constitutional:      General: The patient is not in acute distress. Pulmonary:     Effort: Pulmonary effort is normal. No respiratory distress.  Neurological:     Mental Status: The patient is alert and oriented to person, place, and time.  Psychiatric:        Mood and Affect: Mood normal.        Behavior: Behavior normal.   Assessment and Plan   Allergic sinusitis No clear infectious symptoms.. but if not improving as expected consider COVID testing.  Most consistent with allergic sinusitis. No clear bacterial infection. Treat with change of antihistamine to Xyzal. Hold flonase and complete prednisone taper.  Add nasal saline irrigation.  Depression, major, recurrent, moderate (HCC) Poor control. Continue wellbutrin but change sertraline to cymbalta ( for added pain control benefit given chronic pain).  Close follow up in 2 weeks.. may need increase in dose at that time if tolerating.  Attention and concentration deficit Possibly this issue  due to depression and mood issues. If not improving once mood improved.. consider referral for ADD testing.   I discussed the assessment and treatment plan with the patient. The patient was provided an opportunity to ask questions and all were answered. The patient agreed with the plan and demonstrated an understanding of the instructions.   The patient was advised to call back or seek an  in-person evaluation if the symptoms worsen or if the condition fails to improve as anticipated.   Total visit time 20 minutes, > 50% spent counseling and cordinating patients care.   Eliezer Lofts, MD

## 2019-08-07 NOTE — Assessment & Plan Note (Signed)
No clear infectious symptoms.. but if not improving as expected consider COVID testing.  Most consistent with allergic sinusitis. No clear bacterial infection. Treat with change of antihistamine to Xyzal. Hold flonase and complete prednisone taper.  Add nasal saline irrigation.

## 2019-08-11 ENCOUNTER — Other Ambulatory Visit: Payer: Self-pay | Admitting: Family Medicine

## 2019-08-21 ENCOUNTER — Encounter: Payer: Self-pay | Admitting: Family Medicine

## 2019-08-21 ENCOUNTER — Ambulatory Visit (INDEPENDENT_AMBULATORY_CARE_PROVIDER_SITE_OTHER): Payer: BC Managed Care – PPO | Admitting: Family Medicine

## 2019-08-21 ENCOUNTER — Other Ambulatory Visit: Payer: Self-pay

## 2019-08-21 ENCOUNTER — Other Ambulatory Visit: Payer: Self-pay | Admitting: Family Medicine

## 2019-08-21 VITALS — Ht 68.0 in

## 2019-08-21 DIAGNOSIS — R41840 Attention and concentration deficit: Secondary | ICD-10-CM | POA: Diagnosis not present

## 2019-08-21 DIAGNOSIS — F331 Major depressive disorder, recurrent, moderate: Secondary | ICD-10-CM

## 2019-08-21 MED ORDER — DULOXETINE HCL 60 MG PO CPEP: 60.0000 mg | ORAL_CAPSULE | Freq: Every day | ORAL | 3 refills | Status: DC

## 2019-08-21 NOTE — Assessment & Plan Note (Signed)
Refer for ADD evaluation.

## 2019-08-21 NOTE — Telephone Encounter (Signed)
Last office visit 08/21/2019 for follow up mood.  Last refilled 07/13/2019 for #60 with no refills.  Next Appt: 09/18/2019 for 4 week follow up.

## 2019-08-21 NOTE — Assessment & Plan Note (Signed)
No SE to Cymbalta.. some improvement but not ideal control. Increase to 60 mg daily. Follow up in 4 weeks.

## 2019-08-21 NOTE — Progress Notes (Signed)
VIRTUAL VISIT: PHONE Due to national recommendations of social distancing due to COVID 19, a virtual visit is felt to be most appropriate for this patient at this time.   I connected with the patient on 08/21/19 at  9:00 AM EST by virtual telehealth platform and verified that I am speaking with the correct person using two identifiers.   Interactive audio and video telecommunications were attempted between this provider and patient, however failed, due to patient having technical difficulties OR patient did not have access to video capability.  We continued and completed visit with audio only.   Interactive audio and video telecommunications were attempted between this provider and patient, however failed, due to patient having technical difficulties OR patient did not have access to video capability.  We continued and completed visit with audio only.    I discussed the limitations, risks, security and privacy concerns of performing an evaluation and management service by  virtual telehealth platform and the availability of in person appointments. I also discussed with the patient that there may be a patient responsible charge related to this service. The patient expressed understanding and agreed to proceed.  Patient location: Home Provider Location: [place] Participants: Kerby Nora and Doristine Section   Chief Complaint  Patient presents with  . Follow-up    Mood/Still having sinus issues    History of Present Illness:   39 year old male presents with depression, follow up.   Seen 08/07/2019.Marland Kitchen He feels down, depressed, has anhedonia, decreased motivation. He is tired, usually sleeps well,trouble concentrating.  He is more anxious lately, no panic attacks. Attacks of anger,frustration overwhelming.  No danger to self or other. Was on bupropion and sertraline.. changed to bupropion and Cymbalta.  Today he reports  He is feeling slightly better.  No new SE to cymbalta. He  continues to have anger episodes.  He continues to have concentration issues. PHQ9 SCORE ONLY 08/21/2019 08/07/2019 09/08/2018  Score 11 14 2     ADD:  Has never been evaluated for ADD.  Has always has issue with focus and concentration. He has issues with focusing at work as well as at home. With reading.. he cannot focus and has to read things over an over again. He was a poor student couldn't focus.  Mom would say he was hyperactive... had trouble sleeping at night.   Continued back pain.  COVID 19 screen No recent travel or known exposure to COVID19 The patient denies respiratory symptoms of COVID 19 at this time.  The importance of social distancing was discussed today.   Review of Systems  Constitutional: Negative for chills and fever.  HENT: Negative for congestion and ear pain.   Eyes: Negative for pain and redness.  Respiratory: Negative for cough and shortness of breath.   Cardiovascular: Negative for chest pain, palpitations and leg swelling.  Gastrointestinal: Negative for abdominal pain, blood in stool, constipation, diarrhea, nausea and vomiting.  Genitourinary: Negative for dysuria.  Musculoskeletal: Positive for back pain. Negative for falls and myalgias.  Skin: Negative for rash.  Neurological: Negative for dizziness.  Psychiatric/Behavioral: Positive for depression. The patient has insomnia.       Past Medical History:  Diagnosis Date  . Carpal tunnel syndrome of right wrist 08/2013    reports that he has quit smoking. He has quit using smokeless tobacco. He reports that he does not drink alcohol or use drugs.   Current Outpatient Medications:  .  acetaminophen (TYLENOL) 325 MG tablet, You can  take 2 tablets every 4 hours as needed for pain.  You can alternate with Ibuprofen, or Oxycodone.  Do not exceed 4000 mg per day., Disp: , Rfl:  .  Aspirin-Salicylamide-Caffeine (BC HEADACHE) 325-95-16 MG TABS, Take 2 each by mouth as needed. , Disp: , Rfl:  .  buPROPion  (WELLBUTRIN XL) 300 MG 24 hr tablet, TAKE 1 TABLET BY MOUTH EVERYDAY AT BEDTIME, Disp: 90 tablet, Rfl: 1 .  DULoxetine (CYMBALTA) 30 MG capsule, Take 1 capsule (30 mg total) by mouth daily., Disp: 30 capsule, Rfl: 3 .  fluticasone (FLONASE) 50 MCG/ACT nasal spray, SPRAY 2 SPRAYS INTO EACH NOSTRIL EVERY DAY, Disp: 48 mL, Rfl: 1 .  traMADol (ULTRAM) 50 MG tablet, TAKE 1 TABLET (50 MG TOTAL) BY MOUTH EVERY 12 (TWELVE) HOURS AS NEEDED., Disp: 60 tablet, Rfl: 0   Observations/Objective: Height 5\' 8"  (1.727 m).  Physical Exam  Physical Exam Constitutional:      General: The patient is not in acute distress. Pulmonary:     Effort: Pulmonary effort is normal. No respiratory distress.  Neurological:     Mental Status: The patient is alert and oriented to person, place, and time.  Psychiatric:        Mood and Affect: Mood normal.        Behavior: Behavior normal.   Assessment and Plan Depression, major, recurrent, moderate (HCC) No SE to Cymbalta.. some improvement but not ideal control. Increase to 60 mg daily. Follow up in 4 weeks.  Attention and concentration deficit Refer for ADD evaluation.     I discussed the assessment and treatment plan with the patient. The patient was provided an opportunity to ask questions and all were answered. The patient agreed with the plan and demonstrated an understanding of the instructions.   The patient was advised to call back or seek an in-person evaluation if the symptoms worsen or if the condition fails to improve as anticipated.   Total visit time 20 minutes, > 50% spent counseling and cordinating patients care.   Eliezer Lofts, MD

## 2019-08-21 NOTE — Patient Instructions (Signed)
Referrals will call you to set up ADD evaluation.  Increase Cymbalta to 60 mg daily.

## 2019-08-22 NOTE — Progress Notes (Deleted)
OKay to change to 4

## 2019-09-12 ENCOUNTER — Other Ambulatory Visit: Payer: Self-pay | Admitting: Family Medicine

## 2019-09-18 ENCOUNTER — Ambulatory Visit: Payer: BC Managed Care – PPO | Admitting: Family Medicine

## 2019-09-30 DIAGNOSIS — M778 Other enthesopathies, not elsewhere classified: Secondary | ICD-10-CM | POA: Diagnosis not present

## 2019-09-30 DIAGNOSIS — M65331 Trigger finger, right middle finger: Secondary | ICD-10-CM | POA: Diagnosis not present

## 2019-10-01 ENCOUNTER — Other Ambulatory Visit: Payer: Self-pay | Admitting: Family Medicine

## 2019-10-02 NOTE — Telephone Encounter (Signed)
Last office visit 08/21/2019 follow up mood.  Last refilled 08/21/2019 for #60 with no refills.  No future appointments.

## 2019-10-04 ENCOUNTER — Encounter: Payer: Self-pay | Admitting: Family Medicine

## 2019-10-04 ENCOUNTER — Other Ambulatory Visit: Payer: Self-pay

## 2019-10-04 ENCOUNTER — Ambulatory Visit: Payer: BC Managed Care – PPO | Admitting: Family Medicine

## 2019-10-04 VITALS — BP 120/80 | HR 61 | Temp 98.0°F | Ht 68.0 in | Wt 229.0 lb

## 2019-10-04 DIAGNOSIS — M659 Synovitis and tenosynovitis, unspecified: Secondary | ICD-10-CM | POA: Diagnosis not present

## 2019-10-04 DIAGNOSIS — M65831 Other synovitis and tenosynovitis, right forearm: Secondary | ICD-10-CM | POA: Diagnosis not present

## 2019-10-04 MED ORDER — PREDNISONE 20 MG PO TABS: ORAL_TABLET | ORAL | 0 refills | Status: DC

## 2019-10-04 NOTE — Progress Notes (Signed)
Christopher Mcgillivray T. [name], MD, [place] at [facility] [place], [address]  Phone: [phone]  FAX: [phone]  KVON MCILHENNY - 39 y.o. male  MRN [id number]  Date of Birth: [birth year]  Date: 10/04/2019  PCP: Jinny Sanders, MD  Referral: Jinny Sanders, MD  Chief Complaint  Patient presents with  . Hand Pain    Right-seen at [facility] on Sunday    This visit occurred during the SARS-CoV-2 public health emergency.  Safety protocols were in place, including screening questions prior to the visit, additional usage of staff PPE, and extensive cleaning of exam room while observing appropriate contact time as indicated for disinfecting solutions.   Subjective:   Christopher Hatfield is a 39 y.o. very pleasant male patient who presents with the following:  R 3rd on the R. 4th somewhat with some decresed sensation.   The patient presents with some right-sided third digit discomfort on the dorsal aspect.  He has had some swelling in this region as well, and has some pain with extension.  Additionally he has some swelling in his wrist as well.  He does not describe any flexion pain.  He does not have any pain on the volar aspect.  He did go to urgent care, they thought that he might have a trigger finger.  They placed him in a buddy tape system and the patient subsequently put his finger in a splint for the last few days  Review of Systems is noted in the HPI, as appropriate  Objective:   BP 120/80   Pulse 61   Temp 98 F (36.7 C) (Temporal)   Ht 5\' 8"  (1.727 m)   Wt 229 lb (103.9 kg)   SpO2 98%   BMI 34.82 kg/m   GEN: No acute distress; alert,appropriate. PULM: Breathing comfortably in no respiratory distress PSYCH: Normally interactive.   The distal radius and ulna are nontender.  He does have some bogginess in the wrist and there is some pain with terminal flexion and  extension.  Fingers 3 and 4 do have some pain dorsally with palpation, but minimal to none with flexion.  There is no appreciable trigger finger ring at all.  There is also some mild swelling at the MCP joint on the third  Laboratory and Imaging Data:  Assessment and Plan:     ICD-10-CM   1. Extensor tenosynovitis of wrist, right  M65.831   2. Synovitis of wrist  M65.9    Extensor tenosynovitis, third greater than fourth.  He also has some wrist synovitis.  Discontinue all splinting.  Oral steroids plus basic range of motion.  Follow-up: No follow-ups on file.  Meds ordered this encounter  Medications  . predniSONE (DELTASONE) 20 MG tablet    Sig: 2 tabs po daily for 5 days, then 1 tab po daily for 5 days    Dispense:  15 tablet    Refill:  0   There are no discontinued medications. No orders of the defined types were placed in this encounter.   Signed,  [name]. Dalaysia Harms, MD   Outpatient Encounter Medications as of 10/04/2019  Medication Sig  . acetaminophen (TYLENOL) 325 MG tablet You can take 2 tablets every 4 hours as needed for pain.  You can alternate with Ibuprofen, or Oxycodone.  Do not exceed 4000 mg per day.  . Aspirin-Salicylamide-Caffeine (BC HEADACHE) 325-95-16 MG TABS Take 2  each by mouth as needed.   Marland Kitchen buPROPion (WELLBUTRIN XL) 300 MG 24 hr tablet TAKE 1 TABLET BY MOUTH EVERYDAY AT BEDTIME  . DULoxetine (CYMBALTA) 60 MG capsule TAKE 1 CAPSULE BY MOUTH EVERY DAY  . fluticasone (FLONASE) 50 MCG/ACT nasal spray SPRAY 2 SPRAYS INTO EACH NOSTRIL EVERY DAY  . predniSONE (DELTASONE) 20 MG tablet 2 tabs po daily for 5 days, then 1 tab po daily for 5 days  . traMADol (ULTRAM) 50 MG tablet TAKE 1 TABLET (50 MG TOTAL) BY MOUTH EVERY 12 (TWELVE) HOURS AS NEEDED.   No facility-administered encounter medications on file as of 10/04/2019.

## 2019-10-04 NOTE — Patient Instructions (Signed)
Alleve 2 tabs by mouth two times a day over the counter:   OR   Motrin / Iubuprofen 800 mg recommended three times a day. (Over the counter Motrin, Advil, or Generic Ibuprofen 200 mg tablets.4 tablets by mouth 3 times a day. This equals a prescription strength dose.)

## 2019-10-19 ENCOUNTER — Other Ambulatory Visit: Payer: Self-pay | Admitting: Family Medicine

## 2019-12-07 ENCOUNTER — Other Ambulatory Visit: Payer: Self-pay

## 2019-12-07 ENCOUNTER — Encounter: Payer: Self-pay | Admitting: Family Medicine

## 2019-12-07 ENCOUNTER — Ambulatory Visit: Payer: BC Managed Care – PPO | Admitting: Family Medicine

## 2019-12-07 VITALS — BP 132/70 | HR 70 | Temp 98.3°F | Ht 68.0 in | Wt 232.2 lb

## 2019-12-07 DIAGNOSIS — R41840 Attention and concentration deficit: Secondary | ICD-10-CM | POA: Diagnosis not present

## 2019-12-07 DIAGNOSIS — F331 Major depressive disorder, recurrent, moderate: Secondary | ICD-10-CM

## 2019-12-07 NOTE — Progress Notes (Addendum)
Chief Complaint  Patient presents with  . Medication Management    Doesnt feel his medicaiton is working    History of Present Illness: HPI    39 year old male presents for further discussion of mood medications.   He is currently on wellbutrin XL 300 mg daily and cymbalta 60 mg daily.  He continues to have irritability, anhedonia,difficulty concentrating. Cannot fous, mind racing. Lack of motivation, in a daze, more memory issues more in last 1- 2 months.  He continues to have " attacks" of anger, frustration excessive.  Feels like after COVID vaccines.. he has been doing worse.   No insomnia.   Spoke With Dr. Dellia Cloud psychologist in past.   GAD7 12  PHQ9 12  This visit occurred during the SARS-CoV-2 public health emergency.  Safety protocols were in place, including screening questions prior to the visit, additional usage of staff PPE, and extensive cleaning of exam room while observing appropriate contact time as indicated for disinfecting solutions.   COVID 19 screen:  No recent travel or known exposure to COVID19 The patient denies respiratory symptoms of COVID 19 at this time. The importance of social distancing was discussed today.     Review of Systems  Constitutional: Negative for chills and fever.  HENT: Negative for congestion and ear pain.   Eyes: Negative for pain and redness.  Respiratory: Negative for cough and shortness of breath.   Cardiovascular: Negative for chest pain, palpitations and leg swelling.  Gastrointestinal: Negative for abdominal pain, blood in stool, constipation, diarrhea, nausea and vomiting.  Genitourinary: Negative for dysuria.  Musculoskeletal: Negative for falls and myalgias.  Skin: Negative for rash.  Neurological: Negative for dizziness.  Psychiatric/Behavioral: Negative for depression. The patient is not nervous/anxious.       Past Medical History:  Diagnosis Date  . Carpal tunnel syndrome of right wrist 08/2013    reports  that he has quit smoking. He has quit using smokeless tobacco. He reports that he does not drink alcohol and does not use drugs.   Current Outpatient Medications:  .  acetaminophen (TYLENOL) 325 MG tablet, You can take 2 tablets every 4 hours as needed for pain.  You can alternate with Ibuprofen, or Oxycodone.  Do not exceed 4000 mg per day., Disp: , Rfl:  .  Aspirin-Salicylamide-Caffeine (BC HEADACHE) 325-95-16 MG TABS, Take 2 each by mouth as needed. , Disp: , Rfl:  .  buPROPion (WELLBUTRIN XL) 300 MG 24 hr tablet, TAKE 1 TABLET BY MOUTH EVERYDAY AT BEDTIME, Disp: 90 tablet, Rfl: 1 .  DULoxetine (CYMBALTA) 60 MG capsule, TAKE 1 CAPSULE BY MOUTH EVERY DAY, Disp: 90 capsule, Rfl: 0 .  fluticasone (FLONASE) 50 MCG/ACT nasal spray, SPRAY 2 SPRAYS INTO EACH NOSTRIL EVERY DAY, Disp: 48 mL, Rfl: 1 .  omeprazole (PRILOSEC) 40 MG capsule, Take 40 mg by mouth daily., Disp: , Rfl:    Observations/Objective: Blood pressure 132/70, pulse 70, temperature 98.3 F (36.8 C), temperature source Temporal, height 5\' 8"  (1.727 m), weight 232 lb 4 oz (105.3 kg), SpO2 97 %.  Physical Exam Constitutional:      Appearance: He is well-developed.  HENT:     Head: Normocephalic.     Right Ear: Hearing normal.     Left Ear: Hearing normal.     Nose: Nose normal.  Neck:     Thyroid: No thyroid mass or thyromegaly.     Vascular: No carotid bruit.     Trachea: Trachea normal.  Cardiovascular:  Rate and Rhythm: Normal rate and regular rhythm.     Pulses: Normal pulses.     Heart sounds: Heart sounds not distant. No murmur heard.  No friction rub. No gallop.      Comments: No peripheral edema Pulmonary:     Effort: Pulmonary effort is normal. No respiratory distress.     Breath sounds: Normal breath sounds.  Skin:    General: Skin is warm and dry.     Findings: No rash.  Psychiatric:        Mood and Affect: Affect is flat.        Speech: Speech normal.        Behavior: Behavior is withdrawn.         Thought Content: Thought content normal. Thought content does not include homicidal or suicidal ideation. Thought content does not include homicidal or suicidal plan.        Cognition and Memory: Cognition normal.        Judgment: Judgment normal.     Comments: Does not make eye contact at all when talking      Assessment and Plan   Depression, major, recurrent, moderate ([place])   Not improved with sertraline, cymbalta or wellbutrin alone or in combinations.  Minimal improvement with anger management/counsleing.  Possible ADD issues... has upcoming ADD eval scheduled in 12/2019.  Given lack of improvement.. will move forward with referral to psychiatry for specialist recommendations.     Eliezer Lofts, MD

## 2019-12-07 NOTE — Assessment & Plan Note (Signed)
Not improved with sertraline, cymbalta or wellbutrin alone or in combinations.  Minimal improvement with anger management/counsleing.  Possible ADD issues... has upcoming ADD eval scheduled in 12/2019.  Given lack of improvement.. will move forward with referral to psychiatry for specialist recommendations.

## 2019-12-07 NOTE — Patient Instructions (Signed)
You will be called by the psychiatry office to set up the referral appointment.  Continue current meds for now.

## 2019-12-09 ENCOUNTER — Other Ambulatory Visit: Payer: Self-pay | Admitting: Family Medicine

## 2020-01-14 ENCOUNTER — Ambulatory Visit (INDEPENDENT_AMBULATORY_CARE_PROVIDER_SITE_OTHER): Payer: BC Managed Care – PPO | Admitting: Psychology

## 2020-01-14 DIAGNOSIS — F4010 Social phobia, unspecified: Secondary | ICD-10-CM | POA: Diagnosis not present

## 2020-01-14 DIAGNOSIS — F321 Major depressive disorder, single episode, moderate: Secondary | ICD-10-CM | POA: Diagnosis not present

## 2020-02-03 ENCOUNTER — Other Ambulatory Visit: Payer: Self-pay | Admitting: Family Medicine

## 2020-02-18 ENCOUNTER — Ambulatory Visit: Payer: BC Managed Care – PPO | Admitting: Psychology

## 2020-02-19 DIAGNOSIS — F331 Major depressive disorder, recurrent, moderate: Secondary | ICD-10-CM | POA: Diagnosis not present

## 2020-02-19 DIAGNOSIS — G3184 Mild cognitive impairment, so stated: Secondary | ICD-10-CM | POA: Diagnosis not present

## 2020-02-19 DIAGNOSIS — F4010 Social phobia, unspecified: Secondary | ICD-10-CM | POA: Diagnosis not present

## 2020-02-26 ENCOUNTER — Telehealth: Payer: Self-pay | Admitting: Family Medicine

## 2020-02-26 NOTE — Telephone Encounter (Signed)
error 

## 2020-03-12 DIAGNOSIS — F900 Attention-deficit hyperactivity disorder, predominantly inattentive type: Secondary | ICD-10-CM | POA: Diagnosis not present

## 2020-03-26 ENCOUNTER — Telehealth: Payer: Self-pay | Admitting: *Deleted

## 2020-03-26 NOTE — Telephone Encounter (Signed)
Christopher Hatfield notified as instructed by telephone.  Patient states understanding.

## 2020-03-26 NOTE — Telephone Encounter (Signed)
I am not sure. I believe if the use is mandated by an MD it is okay to use as it is not sedating... but he can look on  [place] DMV website or call them I believe.

## 2020-03-26 NOTE — Telephone Encounter (Signed)
Patient left a voicemail stating that Dr. Ermalene Searing referred him to another doctor and they put him on Adderall. 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 Patient stated that he feels uncomfortable asking his employer this question.

## 2020-05-14 DIAGNOSIS — F411 Generalized anxiety disorder: Secondary | ICD-10-CM | POA: Diagnosis not present

## 2020-05-14 DIAGNOSIS — F900 Attention-deficit hyperactivity disorder, predominantly inattentive type: Secondary | ICD-10-CM | POA: Diagnosis not present

## 2020-07-11 ENCOUNTER — Telehealth: Payer: Self-pay | Admitting: Family Medicine

## 2020-07-11 DIAGNOSIS — Z113 Encounter for screening for infections with a predominantly sexual mode of transmission: Secondary | ICD-10-CM

## 2020-07-11 NOTE — Telephone Encounter (Signed)
Patient is scheduled for cpe labs. Patient is also wanting a whole work up of std's can we order this for him? Thank you, EM

## 2020-07-11 NOTE — Addendum Note (Signed)
Addended by: Kerby Nora E on: 07/11/2020 04:48 PM   Modules accepted: Orders

## 2020-07-11 NOTE — Telephone Encounter (Signed)
Yes, ordered.Marland Kitchen any specific STDs we are looking for?  Have him not urinate 1 hour prior to labs so we can get dirty catch urine.

## 2020-07-11 NOTE — Telephone Encounter (Signed)
Christopher Hatfield notified as instructed by telephone.  There is not a specific STD he needs checked for.  He just wants everything checked.

## 2020-07-23 ENCOUNTER — Encounter: Payer: Self-pay | Admitting: Internal Medicine

## 2020-07-23 ENCOUNTER — Ambulatory Visit (INDEPENDENT_AMBULATORY_CARE_PROVIDER_SITE_OTHER): Payer: BC Managed Care – PPO | Admitting: Internal Medicine

## 2020-07-23 ENCOUNTER — Ambulatory Visit (INDEPENDENT_AMBULATORY_CARE_PROVIDER_SITE_OTHER)
Admission: RE | Admit: 2020-07-23 | Discharge: 2020-07-23 | Disposition: A | Discharge status: Home / Self Care | Payer: BC Managed Care – PPO | Source: Ambulatory Visit | Attending: Internal Medicine | Admitting: Internal Medicine

## 2020-07-23 ENCOUNTER — Other Ambulatory Visit: Payer: Self-pay

## 2020-07-23 VITALS — BP 130/84 | HR 81 | Temp 97.2°F | Wt 220.0 lb

## 2020-07-23 DIAGNOSIS — M25562 Pain in left knee: Secondary | ICD-10-CM

## 2020-07-23 DIAGNOSIS — S8992XA Unspecified injury of left lower leg, initial encounter: Secondary | ICD-10-CM | POA: Diagnosis not present

## 2020-07-23 MED ORDER — PREDNISONE 10 MG PO TABS: ORAL_TABLET | ORAL | 0 refills | Status: DC

## 2020-07-23 NOTE — Progress Notes (Signed)
Subjective:    Patient ID: Christopher Hatfield, male    DOB: [birth year], 40 y.o.   MRN: [id number]  HPI  Patient presents the clinic today with complaint of knee pain.  He reports this started 4 days ago after he was running through the yard chasing after a hawk that was trying to get his chickens. He describes the pain as sharp and shooting behind his kneecap, which radiates up and down his legs. He also reports achy pain which is worse the more he uses it. The pain is worse with weight bearing, walking up and down stairs and with squatting. He has not noticed any swelling, redness, bruising or warmth. He denies numbness and tingling in his left leg. He has tried Ibuprofen, muscle relaxers and ice with minimal relief of symptoms.   Review of Systems      Past Medical History:  Diagnosis Date  . Carpal tunnel syndrome of right wrist 08/2013    Current Outpatient Medications  Medication Sig Dispense Refill  . acetaminophen (TYLENOL) 325 MG tablet You can take 2 tablets every 4 hours as needed for pain.  You can alternate with Ibuprofen, or Oxycodone.  Do not exceed 4000 mg per day.    . Aspirin-Salicylamide-Caffeine (BC HEADACHE) 325-95-16 MG TABS Take 2 each by mouth as needed.     Marland Kitchen buPROPion (WELLBUTRIN XL) 300 MG 24 hr tablet TAKE 1 TABLET BY MOUTH EVERYDAY AT BEDTIME 90 tablet 1  . DULoxetine (CYMBALTA) 60 MG capsule TAKE 1 CAPSULE BY MOUTH EVERY DAY 90 capsule 1  . fluticasone (FLONASE) 50 MCG/ACT nasal spray SPRAY 2 SPRAYS INTO EACH NOSTRIL EVERY DAY 48 mL 1  . omeprazole (PRILOSEC) 40 MG capsule Take 40 mg by mouth daily.     No current facility-administered medications for this visit.    Allergies  Allergen Reactions  . Penicillins Swelling    Has patient had a PCN reaction causing immediate rash, facial/tongue/throat swelling, SOB or lightheadedness with hypotension: no [id number]} Has patient had a PCN reaction causing severe rash involving mucus membranes or skin necrosis:  No Has patient had a PCN reaction that required hospitalization: No Has patient had a PCN reaction occurring within the last 10 years: No If all of the above answers are "NO", then may proceed with Cephalosporin use.  . Sulfonamide Derivatives Swelling    Family History  Problem Relation Age of Onset  . Depression Mother   . Deep vein thrombosis Father   . Heart attack Maternal Grandmother   . Diabetes Paternal Grandmother   . Cancer Paternal Grandfather        PROSTATE    Social History   Socioeconomic History  . Marital status: Married    Spouse name: Not on file  . Number of children: 2  . Years of education: Not on file  . Highest education level: Not on file  Occupational History  . Occupation: tow truck Air traffic controller: [employer]  Tobacco Use  . Smoking status: Former Games developer  . Smokeless tobacco: Former Neurosurgeon  . Tobacco comment: quit smoking 6 years ago  Substance and Sexual Activity  . Alcohol use: No    Alcohol/week: 0.0 standard drinks  . Drug use: No  . Sexual activity: Not on file  Other Topics Concern  . Not on file  Social History Narrative   Regular exercise-- no      Diet: Avoid fast food, some fruits and veggies   Social Determinants of Health  Financial Resource Strain: Not on file  Food Insecurity: Not on file  Transportation Needs: Not on file  Physical Activity: Not on file  Stress: Not on file  Social Connections: Not on file  Intimate Partner Violence: Not on file     Constitutional: Denies fever, malaise, fatigue, headache or abrupt weight changes.  Respiratory: Denies difficulty breathing, shortness of breath, cough or sputum production.   Cardiovascular: Denies chest pain, chest tightness, palpitations or swelling in the hands or feet.  Musculoskeletal: Patient reports left knee pain, difficulty with gait.  Denies decrease in range of motion, muscle pain or joint swelling.  Skin: Denies redness, rashes, lesions or  ulcercations.    No other specific complaints in a complete review of systems (except as listed in HPI above).  Objective:   Physical Exam  BP 130/84   Pulse 81   Temp (!) 97.2 F (36.2 C) (Temporal)   Wt 220 lb (99.8 kg)   SpO2 98%   BMI 33.45 kg/m   Wt Readings from Last 3 Encounters:  12/07/19 232 lb 4 oz (105.3 kg)  10/04/19 229 lb (103.9 kg)  06/06/19 210 lb (95.3 kg)    General: Appears his  stated age, obese in NAD. Skin: Warm, dry and intact. No redness, rash or warmth noted. Cardiovascular: Normal rate. Pulmonary/Chest: Normal effort. Musculoskeletal: Normal flexion and extension of the left knee. Pain with palpation over the medial knee. No joint swelling noted. Negative Lachman's. Positive McMurray. Limps with normal gait. Neurological: Alert and oriented. Sensation intact to BLE.  BMET    Component Value Date/Time   NA 132 (L) 06/06/2019 1648   K 3.6 06/06/2019 1648   CL 97 (L) 06/06/2019 1648   CO2 20 (L) 06/06/2019 1648   GLUCOSE 118 (H) 06/06/2019 1648   BUN 9 06/06/2019 1648   CREATININE 1.03 06/06/2019 1648   CREATININE 1.05 03/04/2017 1503   CALCIUM 8.8 (L) 06/06/2019 1648   GFRNONAA >60 06/06/2019 1648   GFRAA >60 06/06/2019 1648    Lipid Panel     Component Value Date/Time   CHOL 186 06/26/2018 0826   TRIG 48.0 06/26/2018 0826   HDL 52.20 06/26/2018 0826   CHOLHDL 4 06/26/2018 0826   VLDL 9.6 06/26/2018 0826   LDLCALC 125 (H) 06/26/2018 0826   LDLCALC 110 (H) 03/04/2017 1503    CBC    Component Value Date/Time   WBC 8.1 06/06/2019 1648   RBC 4.87 06/06/2019 1648   HGB 14.0 06/06/2019 1648   HCT 42.0 06/06/2019 1648   PLT 212 06/06/2019 1648   MCV 86.2 06/06/2019 1648   MCH 28.7 06/06/2019 1648   MCHC 33.3 06/06/2019 1648   RDW 13.0 06/06/2019 1648   LYMPHSABS 0.6 (L) 06/06/2019 1648   MONOABS 0.7 06/06/2019 1648   EOSABS 0.0 06/06/2019 1648   BASOSABS 0.0 06/06/2019 1648    Hgb A1C No results found for:  HGBA1C          Assessment & Plan:   Acute Left Knee Pain:  Concern for meniscal injury Xray left knee today RX for Pred Taper x 6 days Apply ice for 10 min BID Consider referral for PT pending xray  Will follow up after xray, return precautions discussed  Nicki Reaper, NP This visit occurred during the SARS-CoV-2 public health emergency.  Safety protocols were in place, including screening questions prior to the visit, additional usage of staff PPE, and extensive cleaning of exam room while observing appropriate contact time as indicated for  disinfecting solutions.

## 2020-07-24 ENCOUNTER — Encounter: Payer: Self-pay | Admitting: Internal Medicine

## 2020-07-24 NOTE — Patient Instructions (Signed)
Journal for Nurse Practitioners, 15(4), 263-267. Retrieved April 03, 2018 from http://clinicalkey.com/nursing">  Knee Exercises Ask your health care provider which exercises are safe for you. Do exercises exactly as told by your health care provider and adjust them as directed. It is normal to feel mild stretching, pulling, tightness, or discomfort as you do these exercises. Stop right away if you feel sudden pain or your pain gets worse. Do not begin these exercises until told by your health care provider. Stretching and range-of-motion exercises These exercises warm up your muscles and joints and improve the movement and flexibility of your knee. These exercises also help to relieve pain and swelling. Knee extension, prone 1. Lie on your abdomen (prone position) on a bed. 2. Place your left / right knee just beyond the edge of the surface so your knee is not on the bed. You can put a towel under your left / right thigh just above your kneecap for comfort. 3. Relax your leg muscles and allow gravity to straighten your knee (extension). You should feel a stretch behind your left / right knee. 4. Hold this position for __________ seconds. 5. Scoot up so your knee is supported between repetitions. Repeat __________ times. Complete this exercise __________ times a day. Knee flexion, active 1. Lie on your back with both legs straight. If this causes back discomfort, bend your left / right knee so your foot is flat on the floor. 2. Slowly slide your left / right heel back toward your buttocks. Stop when you feel a gentle stretch in the front of your knee or thigh (flexion). 3. Hold this position for __________ seconds. 4. Slowly slide your left / right heel back to the starting position. Repeat __________ times. Complete this exercise __________ times a day.   Quadriceps stretch, prone 1. Lie on your abdomen on a firm surface, such as a bed or padded floor. 2. Bend your left / right knee and hold  your ankle. If you cannot reach your ankle or pant leg, loop a belt around your foot and grab the belt instead. 3. Gently pull your heel toward your buttocks. Your knee should not slide out to the side. You should feel a stretch in the front of your thigh and knee (quadriceps). 4. Hold this position for __________ seconds. Repeat __________ times. Complete this exercise __________ times a day.   Hamstring, supine 1. Lie on your back (supine position). 2. Loop a belt or towel over the ball of your left / right foot. The ball of your foot is on the walking surface, right under your toes. 3. Straighten your left / right knee and slowly pull on the belt to raise your leg until you feel a gentle stretch behind your knee (hamstring). ? Do not let your knee bend while you do this. ? Keep your other leg flat on the floor. 4. Hold this position for __________ seconds. Repeat __________ times. Complete this exercise __________ times a day. Strengthening exercises These exercises build strength and endurance in your knee. Endurance is the ability to use your muscles for a long time, even after they get tired. Quadriceps, isometric This exercise stretches the muscles in front of your thigh (quadriceps) without moving your knee joint (isometric). 1. Lie on your back with your left / right leg extended and your other knee bent. Put a rolled towel or small pillow under your knee if told by your health care provider. 2. Slowly tense the muscles in the front of your   left / right thigh. You should see your kneecap slide up toward your hip or see increased dimpling just above the knee. This motion will push the back of the knee toward the floor. 3. For __________ seconds, hold the muscle as tight as you can without increasing your pain. 4. Relax the muscles slowly and completely. Repeat __________ times. Complete this exercise __________ times a day.   Straight leg raises This exercise stretches the muscles in  front of your thigh (quadriceps) and the muscles that move your hips (hip flexors). 1. Lie on your back with your left / right leg extended and your other knee bent. 2. Tense the muscles in the front of your left / right thigh. You should see your kneecap slide up or see increased dimpling just above the knee. Your thigh may even shake a bit. 3. Keep these muscles tight as you raise your leg 4-6 inches (10-15 cm) off the floor. Do not let your knee bend. 4. Hold this position for __________ seconds. 5. Keep these muscles tense as you lower your leg. 6. Relax your muscles slowly and completely after each repetition. Repeat __________ times. Complete this exercise __________ times a day. Hamstring, isometric 1. Lie on your back on a firm surface. 2. Bend your left / right knee about __________ degrees. 3. Dig your left / right heel into the surface as if you are trying to pull it toward your buttocks. Tighten the muscles in the back of your thighs (hamstring) to "dig" as hard as you can without increasing any pain. 4. Hold this position for __________ seconds. 5. Release the tension gradually and allow your muscles to relax completely for __________ seconds after each repetition. Repeat __________ times. Complete this exercise __________ times a day. Hamstring curls If told by your health care provider, do this exercise while wearing ankle weights. Begin with __________ lb weights. Then increase the weight by 1 lb (0.5 kg) increments. Do not wear ankle weights that are more than __________ lb. 1. Lie on your abdomen with your legs straight. 2. Bend your left / right knee as far as you can without feeling pain. Keep your hips flat against the floor. 3. Hold this position for __________ seconds. 4. Slowly lower your leg to the starting position. Repeat __________ times. Complete this exercise __________ times a day.   Squats This exercise strengthens the muscles in front of your thigh and knee  (quadriceps). 1. Stand in front of a table, with your feet and knees pointing straight ahead. You may rest your hands on the table for balance but not for support. 2. Slowly bend your knees and lower your hips like you are going to sit in a chair. ? Keep your weight over your heels, not over your toes. ? Keep your lower legs upright so they are parallel with the table legs. ? Do not let your hips go lower than your knees. ? Do not bend lower than told by your health care provider. ? If your knee pain increases, do not bend as low. 3. Hold the squat position for __________ seconds. 4. Slowly push with your legs to return to standing. Do not use your hands to pull yourself to standing. Repeat __________ times. Complete this exercise __________ times a day. Wall slides This exercise strengthens the muscles in front of your thigh and knee (quadriceps). 1. Lean your back against a smooth wall or door, and walk your feet out 18-24 inches (46-61 cm) from it. 2.   Place your feet hip-width apart. 3. Slowly slide down the wall or door until your knees bend __________ degrees. Keep your knees over your heels, not over your toes. Keep your knees in line with your hips. 4. Hold this position for __________ seconds. Repeat __________ times. Complete this exercise __________ times a day.   Straight leg raises This exercise strengthens the muscles that rotate the leg at the hip and move it away from your body (hip abductors). 1. Lie on your side with your left / right leg in the top position. Lie so your head, shoulder, knee, and hip line up. You may bend your bottom knee to help you keep your balance. 2. Roll your hips slightly forward so your hips are stacked directly over each other and your left / right knee is facing forward. 3. Leading with your heel, lift your top leg 4-6 inches (10-15 cm). You should feel the muscles in your outer hip lifting. ? Do not let your foot drift forward. ? Do not let your  knee roll toward the ceiling. 4. Hold this position for __________ seconds. 5. Slowly return your leg to the starting position. 6. Let your muscles relax completely after each repetition. Repeat __________ times. Complete this exercise __________ times a day.   Straight leg raises This exercise stretches the muscles that move your hips away from the front of the pelvis (hip extensors). 1. Lie on your abdomen on a firm surface. You can put a pillow under your hips if that is more comfortable. 2. Tense the muscles in your buttocks and lift your left / right leg about 4-6 inches (10-15 cm). Keep your knee straight as you lift your leg. 3. Hold this position for __________ seconds. 4. Slowly lower your leg to the starting position. 5. Let your leg relax completely after each repetition. Repeat __________ times. Complete this exercise __________ times a day. This information is not intended to replace advice given to you by your health care provider. Make sure you discuss any questions you have with your health care provider. Document Revised: 04/04/2018 Document Reviewed: 04/04/2018 Elsevier Patient Education  2021 [employer]  

## 2020-08-07 ENCOUNTER — Other Ambulatory Visit: Payer: Self-pay

## 2020-08-07 ENCOUNTER — Other Ambulatory Visit (INDEPENDENT_AMBULATORY_CARE_PROVIDER_SITE_OTHER): Payer: BC Managed Care – PPO

## 2020-08-07 DIAGNOSIS — Z113 Encounter for screening for infections with a predominantly sexual mode of transmission: Secondary | ICD-10-CM | POA: Diagnosis not present

## 2020-08-08 LAB — HEPATITIS PANEL, ACUTE
Hep A IgM: NONREACTIVE
Hep B C IgM: NONREACTIVE
Hepatitis B Surface Ag: NONREACTIVE
Hepatitis C Ab: NONREACTIVE
SIGNAL TO CUT-OFF: 0 (ref <1.00)

## 2020-08-08 LAB — C. TRACHOMATIS/N. GONORRHOEAE RNA
C. trachomatis RNA, TMA: NOT DETECTED
N. gonorrhoeae RNA, TMA: NOT DETECTED

## 2020-08-08 LAB — RPR: RPR Ser Ql: NONREACTIVE

## 2020-08-08 LAB — HIV ANTIBODY (ROUTINE TESTING W REFLEX): HIV 1&2 Ab, 4th Generation: NONREACTIVE

## 2020-08-13 ENCOUNTER — Other Ambulatory Visit: Payer: Self-pay

## 2020-08-14 ENCOUNTER — Other Ambulatory Visit: Payer: Self-pay

## 2020-08-14 ENCOUNTER — Ambulatory Visit (INDEPENDENT_AMBULATORY_CARE_PROVIDER_SITE_OTHER): Payer: BC Managed Care – PPO | Admitting: Family Medicine

## 2020-08-14 VITALS — BP 120/90 | HR 78 | Temp 98.1°F | Ht 68.0 in | Wt 217.5 lb

## 2020-08-14 DIAGNOSIS — M5416 Radiculopathy, lumbar region: Secondary | ICD-10-CM

## 2020-08-14 DIAGNOSIS — Z13220 Encounter for screening for lipoid disorders: Secondary | ICD-10-CM | POA: Diagnosis not present

## 2020-08-14 DIAGNOSIS — F331 Major depressive disorder, recurrent, moderate: Secondary | ICD-10-CM | POA: Diagnosis not present

## 2020-08-14 DIAGNOSIS — Z0000 Encounter for general adult medical examination without abnormal findings: Secondary | ICD-10-CM

## 2020-08-14 DIAGNOSIS — M25562 Pain in left knee: Secondary | ICD-10-CM

## 2020-08-14 DIAGNOSIS — G8929 Other chronic pain: Secondary | ICD-10-CM

## 2020-08-14 DIAGNOSIS — R41840 Attention and concentration deficit: Secondary | ICD-10-CM

## 2020-08-14 LAB — COMPREHENSIVE METABOLIC PANEL
ALT: 16 U/L (ref 0–53)
AST: 16 U/L (ref 0–37)
Albumin: 4.5 g/dL (ref 3.5–5.2)
Alkaline Phosphatase: 53 U/L (ref 39–117)
BUN: 11 mg/dL (ref 6–23)
CO2: 30 mEq/L (ref 19–32)
Calcium: 9.5 mg/dL (ref 8.4–10.5)
Chloride: 105 mEq/L (ref 96–112)
Creatinine, Ser: 0.94 mg/dL (ref 0.40–1.50)
GFR: 100.41 mL/min (ref >60.00)
Glucose, Bld: 92 mg/dL (ref 70–99)
Potassium: 4.5 mEq/L (ref 3.5–5.1)
Sodium: 138 mEq/L (ref 135–145)
Total Bilirubin: 0.5 mg/dL (ref 0.2–1.2)
Total Protein: 6.8 g/dL (ref 6.0–8.3)

## 2020-08-14 LAB — LIPID PANEL
Cholesterol: 159 mg/dL (ref 0–200)
HDL: 72 mg/dL (ref >39.00)
LDL Cholesterol: 80 mg/dL (ref 0–99)
NonHDL: 87.05
Total CHOL/HDL Ratio: 2
Triglycerides: 33 mg/dL (ref 0.0–149.0)
VLDL: 6.6 mg/dL (ref 0.0–40.0)

## 2020-08-14 NOTE — Progress Notes (Signed)
Patient ID: Christopher Hatfield, male    DOB: May 28, 1981, 40 y.o.   MRN: [id number]  This visit was conducted in person.  BP 120/90   Pulse 78   Temp 98.1 F (36.7 C) (Temporal)   Ht 5\' 8"  (1.727 m)   Wt 217 lb 8 oz (98.7 kg)   SpO2 99%   BMI 33.07 kg/m    CC:  Chief Complaint  Patient presents with  . Annual Exam  . Knee Pain    L knee    Subjective:   HPI: Christopher Hatfield is a 40 y.o. male presenting on 08/14/2020 for Annual Exam and Knee Pain (L knee)  New issue:  Left knee pain: Saw 08/16/2020 on 07/23/2020 for similar issue. Note reviewed in detail. She felt is was concerning for meniscal injury. Occurred hours after running across, up/down hill in snow chasing bird. Treated with prednisone taper.  X-ray was unremarkable.  He reports today  knee pain continues to be severe. Now ongoing x 4 weeks.  Initially pain was better with rest, but when went back to work, pain returned. Burning pain in medial knee, and pain under kneecap. Knee gets tired and occ has to limp.  Treats with elevation, brace and ice, ibuprofen and tylenol helps minimally.  MDD, ADD: Followed by psychiatry (Dr. 07/25/2020), now on Adderall XR 30 mg.. This has helped with concentration and motivation, but still anger issues.  PHQ9 today 10 No longer on  wellbutrin and cymbalta.  For anger started on gabapentin 300 mg daily prn.. helps some but some SE.. has decreased it some to every few days.       Relevant past medical, surgical, family and social history reviewed and updated as indicated. Interim medical history since our last visit reviewed. Allergies and medications reviewed and updated. Outpatient Medications Prior to Visit  Medication Sig Dispense Refill  . acetaminophen (TYLENOL) 325 MG tablet You can take 2 tablets every 4 hours as needed for pain.  You can alternate with Ibuprofen, or Oxycodone.  Do not exceed 4000 mg per day.    . amphetamine-dextroamphetamine (ADDERALL XR) 20 MG 24 hr  capsule Take 20 mg by mouth daily at 12 noon. At noon    . amphetamine-dextroamphetamine (ADDERALL XR) 30 MG 24 hr capsule Take 30 mg by mouth in the morning.    . Aspirin-Salicylamide-Caffeine 325-95-16 MG TABS Take 2 each by mouth as needed.     . fluticasone (FLONASE) 50 MCG/ACT nasal spray SPRAY 2 SPRAYS INTO EACH NOSTRIL EVERY DAY 48 mL 1  . gabapentin (NEURONTIN) 300 MG capsule Take 300 mg by mouth as needed.    Evelene Croon omeprazole (PRILOSEC) 40 MG capsule Take 40 mg by mouth as needed.    . predniSONE (DELTASONE) 10 MG tablet Take 6 tabs day 1, 5 tabs day 2, 4 tabs day 3, 3 tabs day 4, 2 tabs day 5, 1 tab day 6 21 tablet 0   No facility-administered medications prior to visit.     Per HPI unless specifically indicated in ROS section below Review of Systems Objective:  BP 120/90   Pulse 78   Temp 98.1 F (36.7 C) (Temporal)   Ht 5\' 8"  (1.727 m)   Wt 217 lb 8 oz (98.7 kg)   SpO2 99%   BMI 33.07 kg/m   Wt Readings from Last 3 Encounters:  08/14/20 217 lb 8 oz (98.7 kg)  07/23/20 220 lb (99.8 kg)  12/07/19 232 lb 4 oz (  105.3 kg)      Physical Exam Constitutional:      General: Vital signs are normal.     Appearance: He is well-developed and well-nourished.  HENT:     Head: Normocephalic.     Right Ear: Hearing normal.     Left Ear: Hearing normal.     Nose: Nose normal.     Mouth/Throat:     Mouth: Oropharynx is clear and moist and mucous membranes are normal.  Neck:     Thyroid: No thyroid mass or thyromegaly.     Vascular: No carotid bruit.     Trachea: Trachea normal.  Cardiovascular:     Rate and Rhythm: Normal rate and regular rhythm.     Pulses: Normal pulses.     Heart sounds: Heart sounds not distant. No murmur heard. No friction rub. No gallop.      Comments: No peripheral edema Pulmonary:     Effort: Pulmonary effort is normal. No respiratory distress.     Breath sounds: Normal breath sounds.  Musculoskeletal:     Right knee: Normal.     Left knee: No  swelling, deformity or bony tenderness. Normal range of motion. No tenderness. No MCL laxity or ACL laxity.Normal meniscus. Normal pulse.  Skin:    General: Skin is warm, dry and intact.     Findings: No rash.  Psychiatric:        Mood and Affect: Mood and affect normal.        Speech: Speech normal.        Behavior: Behavior normal.        Thought Content: Thought content normal.       Results for orders placed or performed in visit on 08/07/20  C. trachomatis/N. gonorrhoeae RNA   Specimen: Urine  Result Value Ref Range   C. trachomatis RNA, TMA NOT DETECTED NOT DETECT   N. gonorrhoeae RNA, TMA NOT DETECTED NOT DETECT  RPR  Result Value Ref Range   RPR Ser Ql NON-REACTIVE NON-REACTI  Hepatitis panel, acute  Result Value Ref Range   Hep A IgM NON-REACTIVE NON-REACTI   Hepatitis B Surface Ag NON-REACTIVE NON-REACTI   Hep B C IgM NON-REACTIVE NON-REACTI   Hepatitis C Ab NON-REACTIVE NON-REACTI   SIGNAL TO CUT-OFF 0.00 <1.00  HIV Antibody (routine testing w rflx)  Result Value Ref Range   HIV 1&2 Ab, 4th Generation NON-REACTIVE NON-REACTI    This visit occurred during the SARS-CoV-2 public health emergency.  Safety protocols were in place, including screening questions prior to the visit, additional usage of staff PPE, and extensive cleaning of exam room while observing appropriate contact time as indicated for disinfecting solutions.   COVID 19 screen:  No recent travel or known exposure to COVID19 The patient denies respiratory symptoms of COVID 19 at this time. The importance of social distancing was discussed today.   Assessment and Plan The patient's preventative maintenance and recommended screening tests for an annual wellness exam were reviewed in full today. Brought up to date unless services declined.  Counselled on the importance of diet, exercise, and its role in overall health and mortality. The patient's FH and SH was reviewed, including their home life, tobacco  status, and drug and alcohol status.    Vaccines: Up to dateexcept refused flu vaccine. S/P 2 COVID vaccine. Had SE to second one. Colon: no early family history. Prostate cancer in dad at age early 9s No concerns in genital or rectal area. Nonsmoker.  HIV testing  And STD test negative.    Problem List Items Addressed This Visit    Attention and concentration deficit    Improved control on Adderall XR.. followed by psychiatry. No SE.   Follow diastolic BP on stimulant.  BP Readings from Last 3 Encounters:  08/14/20 120/90  07/23/20 130/84  12/07/19 132/70         Chronic radicular low back pain   Depression, major, recurrent, moderate (HCC)    Using gabapentin for anger issues.  Followed now by Psychiatrist.       Other Visit Diagnoses    Routine general medical examination at a health care facility    -  Primary   Acute pain of left knee       Screening cholesterol level       Relevant Orders   Lipid panel   Comprehensive metabolic panel        Kerby Nora, MD

## 2020-08-14 NOTE — Assessment & Plan Note (Signed)
Improved control on Adderall XR.. followed by psychiatry. No SE.   Follow diastolic BP on stimulant.  BP Readings from Last 3 Encounters:  08/14/20 120/90  07/23/20 130/84  12/07/19 132/70

## 2020-08-14 NOTE — Assessment & Plan Note (Signed)
Using gabapentin for anger issues.  Followed now by Psychiatrist.

## 2020-08-14 NOTE — Patient Instructions (Addendum)
Can use diclofenac gel/cream  Four times a day. Continue bracing , ice and elevation.  Call call for further eval or referral in next 3-4 weeks if not improving.  Please stop at the lab to have labs drawn.  Call back with the COVID vaccine date.

## 2020-09-22 DIAGNOSIS — F900 Attention-deficit hyperactivity disorder, predominantly inattentive type: Secondary | ICD-10-CM | POA: Diagnosis not present

## 2020-09-22 DIAGNOSIS — F4322 Adjustment disorder with anxiety: Secondary | ICD-10-CM | POA: Diagnosis not present

## 2021-02-16 ENCOUNTER — Other Ambulatory Visit: Payer: Self-pay | Admitting: Family Medicine

## 2021-02-25 ENCOUNTER — Other Ambulatory Visit: Payer: Self-pay

## 2021-02-25 ENCOUNTER — Encounter: Payer: Self-pay | Admitting: Family Medicine

## 2021-02-25 ENCOUNTER — Ambulatory Visit
Admission: RE | Admit: 2021-02-25 | Discharge: 2021-02-25 | Disposition: A | Discharge status: Home / Self Care | Payer: BC Managed Care – PPO | Source: Ambulatory Visit | Attending: Family Medicine | Admitting: Family Medicine

## 2021-02-25 ENCOUNTER — Ambulatory Visit: Payer: BC Managed Care – PPO | Admitting: Family Medicine

## 2021-02-25 ENCOUNTER — Ambulatory Visit
Admission: RE | Admit: 2021-02-25 | Discharge: 2021-02-25 | Disposition: A | Discharge status: Home / Self Care | Payer: BC Managed Care – PPO | Attending: Family Medicine | Admitting: Family Medicine

## 2021-02-25 VITALS — BP 140/78 | HR 74 | Temp 98.2°F | Ht 68.0 in | Wt 217.0 lb

## 2021-02-25 DIAGNOSIS — G8929 Other chronic pain: Secondary | ICD-10-CM | POA: Diagnosis not present

## 2021-02-25 DIAGNOSIS — M25512 Pain in left shoulder: Secondary | ICD-10-CM | POA: Diagnosis not present

## 2021-02-25 MED ORDER — NAPROXEN 500 MG PO TABS: ORAL_TABLET | ORAL | 0 refills | Status: DC
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
## 2021-02-25 NOTE — Patient Instructions (Signed)
Suspicious for left rotator cuff tendonitis.  Treat with exercises provided today.  May continue voltaren gel.  Treat with naprosyn anti inflammatory 500mg  daily with lunch or dinner, take omeprazole daily while on naprosyn. Do this for 7-10 days.  Let know if not better with this.

## 2021-02-25 NOTE — Assessment & Plan Note (Signed)
Story/exam suspicious for RTC injury specifically supraspinatus tendinopathy. Given chronicity, will send for L shoulder xray. Will prescribe naprosyn 500mg  to take daily for 7-10 days, along with nexium, discussed NSAID use (take with food, will just do once daily in GERD history). Provided with RTC injury exercises from [facility] pt advisor - he has resistance band to use at home.

## 2021-03-18 DIAGNOSIS — F3189 Other bipolar disorder: Secondary | ICD-10-CM | POA: Diagnosis not present

## 2021-03-18 DIAGNOSIS — F900 Attention-deficit hyperactivity disorder, predominantly inattentive type: Secondary | ICD-10-CM | POA: Diagnosis not present

## 2021-07-30 ENCOUNTER — Telehealth: Payer: Self-pay | Admitting: Family Medicine

## 2021-07-30 DIAGNOSIS — Z13220 Encounter for screening for lipoid disorders: Secondary | ICD-10-CM

## 2021-07-30 NOTE — Telephone Encounter (Signed)
-----   Message from Alvina Chou sent at 07/30/2021  3:33 PM EST ----- Regarding: Lab orders for Wednesday, 2.15.23 Patient is scheduled for CPX labs, please order future labs, Thanks , Camelia Eng

## 2021-08-12 ENCOUNTER — Other Ambulatory Visit (INDEPENDENT_AMBULATORY_CARE_PROVIDER_SITE_OTHER): Payer: BC Managed Care – PPO

## 2021-08-12 ENCOUNTER — Other Ambulatory Visit: Payer: Self-pay

## 2021-08-12 DIAGNOSIS — Z13220 Encounter for screening for lipoid disorders: Secondary | ICD-10-CM

## 2021-08-13 LAB — LIPID PANEL
Cholesterol: 167 mg/dL (ref 0–200)
HDL: 78.3 mg/dL (ref >39.00)
LDL Cholesterol: 82 mg/dL (ref 0–99)
NonHDL: 88.71
Total CHOL/HDL Ratio: 2
Triglycerides: 35 mg/dL (ref 0.0–149.0)
VLDL: 7 mg/dL (ref 0.0–40.0)

## 2021-08-13 LAB — COMPREHENSIVE METABOLIC PANEL
ALT: 20 U/L (ref 0–53)
AST: 20 U/L (ref 0–37)
Albumin: 4.5 g/dL (ref 3.5–5.2)
Alkaline Phosphatase: 53 U/L (ref 39–117)
BUN: 12 mg/dL (ref 6–23)
CO2: 31 mEq/L (ref 19–32)
Calcium: 9.4 mg/dL (ref 8.4–10.5)
Chloride: 103 mEq/L (ref 96–112)
Creatinine, Ser: 0.99 mg/dL (ref 0.40–1.50)
GFR: 93.7 mL/min (ref >60.00)
Glucose, Bld: 88 mg/dL (ref 70–99)
Potassium: 4.6 mEq/L (ref 3.5–5.1)
Sodium: 138 mEq/L (ref 135–145)
Total Bilirubin: 0.5 mg/dL (ref 0.2–1.2)
Total Protein: 6.8 g/dL (ref 6.0–8.3)

## 2021-08-13 NOTE — Progress Notes (Signed)
No critical labs need to be addressed urgently. We will discuss labs in detail at upcoming office visit.   

## 2021-08-18 ENCOUNTER — Encounter: Payer: Self-pay | Admitting: Family Medicine

## 2021-08-18 ENCOUNTER — Other Ambulatory Visit: Payer: Self-pay

## 2021-08-18 ENCOUNTER — Ambulatory Visit (INDEPENDENT_AMBULATORY_CARE_PROVIDER_SITE_OTHER): Payer: BC Managed Care – PPO | Admitting: Family Medicine

## 2021-08-18 VITALS — BP 116/68 | HR 78 | Temp 97.7°F | Ht 68.0 in | Wt 210.0 lb

## 2021-08-18 DIAGNOSIS — G8929 Other chronic pain: Secondary | ICD-10-CM | POA: Diagnosis not present

## 2021-08-18 DIAGNOSIS — F331 Major depressive disorder, recurrent, moderate: Secondary | ICD-10-CM | POA: Diagnosis not present

## 2021-08-18 DIAGNOSIS — M25512 Pain in left shoulder: Secondary | ICD-10-CM | POA: Diagnosis not present

## 2021-08-18 DIAGNOSIS — Z0000 Encounter for general adult medical examination without abnormal findings: Secondary | ICD-10-CM | POA: Diagnosis not present

## 2021-08-18 MED ORDER — OMEPRAZOLE 40 MG PO CPDR: 40.0000 mg | DELAYED_RELEASE_CAPSULE | ORAL | 1 refills | Status: DC | PRN

## 2021-08-18 NOTE — Patient Instructions (Addendum)
Work on [employer] and regular exercise.  Call if interested in sleep apnea referral for sleep testing.  Contact Dr. Evelene Croon for adjustment of medicaiotns.
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
## 2021-08-19 ENCOUNTER — Encounter: Payer: Self-pay | Admitting: Family Medicine

## 2021-08-19 NOTE — Telephone Encounter (Signed)
Immunization and Medication record updated.  FYI to Dr. Ermalene Searing.

## 2021-09-07 NOTE — Assessment & Plan Note (Signed)
Followed by Dr. Rolena Infante at [facility] through workers comp. ?No indication for injection or surgery. ?

## 2021-09-07 NOTE — Assessment & Plan Note (Signed)
followed by Dr. Sharl Ma. On Adderall XR and gabapentin 600 mg  TID. ?Going to counselor. ?

## 2021-09-09 DIAGNOSIS — F900 Attention-deficit hyperactivity disorder, predominantly inattentive type: Secondary | ICD-10-CM | POA: Diagnosis not present

## 2021-09-09 DIAGNOSIS — F331 Major depressive disorder, recurrent, moderate: Secondary | ICD-10-CM | POA: Diagnosis not present

## 2021-09-23 ENCOUNTER — Telehealth: Payer: Self-pay

## 2021-09-23 ENCOUNTER — Ambulatory Visit
Admission: EM | Admit: 2021-09-23 | Discharge: 2021-09-23 | Disposition: A | Discharge status: Home / Self Care | Payer: BC Managed Care – PPO | Attending: Family Medicine | Admitting: Family Medicine

## 2021-09-23 DIAGNOSIS — W460XXA Contact with hypodermic needle, initial encounter: Secondary | ICD-10-CM

## 2021-09-23 DIAGNOSIS — Z209 Contact with and (suspected) exposure to unspecified communicable disease: Secondary | ICD-10-CM | POA: Diagnosis not present

## 2021-09-23 DIAGNOSIS — Z7721 Contact with and (suspected) exposure to potentially hazardous body fluids: Secondary | ICD-10-CM

## 2021-09-23 DIAGNOSIS — W273XXA Contact with needle (sewing), initial encounter: Secondary | ICD-10-CM

## 2021-09-23 DIAGNOSIS — Z23 Encounter for immunization: Secondary | ICD-10-CM

## 2021-09-23 MED ORDER — TETANUS-DIPHTH-ACELL PERTUSSIS 5-2.5-18.5 LF-MCG/0.5 IM SUSY
0.5000 mL | PREFILLED_SYRINGE | Freq: Once | INTRAMUSCULAR | Status: AC
  Administered 2021-09-23: 0.5 mL via INTRAMUSCULAR

## 2021-09-23 MED ORDER — ELVITEG-COBIC-EMTRICIT-TENOFAF 150-150-200-10 MG PO TABS: 1.0000 | ORAL_TABLET | Freq: Every day | ORAL | 0 refills | Status: DC

## 2021-09-23 NOTE — Telephone Encounter (Signed)
[facility] - Client ?TELEPHONE ADVICE RECORD ?[facility]? ?Patient ?Name: ?Christopher RUSEK ?Hatfield ?Gender: Male ?DOB: [birth year] ?Age: 41 Y 4 D ?Return ?Phone ?Number: ?[id number] ?(Primary) ?Address: ?City/ ?State/ ?Zip: [address] [place] ?[address] ?Client [facility] - Client ?Client Site [facility] ?Provider Eliezer Lofts - MD ?Contact Type Call ?Who Is Calling Patient / Member / Family / Caregiver ?Call Type Triage / Clinical ?Relationship To Patient Self ?Return Phone Number [phone] (Primary) ?Chief Complaint Blood and Body Fluid Exposure ?Reason for Call Symptomatic / Request for Health Information ?Initial Comment Patient was cleaning his car up and accidently ?pricked himself with diabetes injection medicine ?like the needles. He just bought the vehicle. He is ?trying to get in contact with the people he bought ?the car from. He was bleeding. He is not bleeding ?anymore. ?Translation No ?Nurse Assessment ?Nurse: Hassell Done, RN, Melanie Date/Time Eilene Ghazi Time): 09/23/2021 4:20:05 PM ?Confirm and document reason for call. If ?symptomatic, describe symptoms. ?---Caller states he stuck himself with a needle that was ?in a car he just purchased. He has tried to get in touch ?with the previous owner of the car. ?Does the patient have any new or worsening ?symptoms? ---Yes ?Will a triage be completed? ---Yes ?Related visit to physician within the last 2 weeks? ---No ?Does the PT have any chronic conditions? (i.e. ?diabetes, asthma, this includes High risk factors for ?pregnancy, etc.) ?---No ?Is this a behavioral health or substance abuse call? ---No ?Guidelines ?Guideline Title Affirmed Question Affirmed Notes Nurse Date/Time (Eastern ?Time) ?Needlestick [1] SOURCE person's ?HIV or hepatitis ?status is UNKNOWN ?(e.g., unable to test ?source, unknown ?source) AND [2] ?needlestick within ?past 72 hours ?Hassell Done, RN, Melanie 09/23/2021  4:21:41 ?PM ?PLEASE NOTE: All timestamps contained within this report are represented as [place] Standard Time. ?CONFIDENTIALTY NOTICE: This fax transmission is intended only for the addressee. It contains information that is legally privileged, confidential or ?otherwise protected from use or disclosure. If you are not the intended recipient, you are strictly prohibited from reviewing, disclosing, copying using ?or disseminating any of this information or taking any action in reliance on or regarding this information. If you have received this fax in error, please ?notify us immediately by telephone so that we can arrange for its return to [place]. Phone: [phone], Toll-Free: [phone], Fax: [phone] ?Page: 2 of 2 ?Call Id: [id number] ?Disp. Time (Eastern ?Time) Disposition Final User ?09/23/2021 4:25:59 PM See HCP within 4 Hours (or ?PCP triage) ?Yes Hassell Done, RN, Threasa Beards ?Caller Disagree/Comply Comply ?Caller Understands Yes ?PreDisposition Call Doctor ?Care Advice Given Per Guideline ?SEE HCP (OR PCP TRIAGE) WITHIN 4 HOURS: * IF OFFICE WILL BE OPEN: You need to be seen within the next 3 or 4 ?hours. Call your doctor (or NP/PA) now or as soon as the office opens. [facility] [place] [facility] THE WOUND: * Wash wound and skin ?with soap and water. CALL BACK IF: * You become worse CARE ADVICE given per Needlestick (Adult) guideline. ?Referrals ?GO TO FACILITY UNDECIDE ?

## 2021-09-23 NOTE — Telephone Encounter (Signed)
Unable to reach pt by phone; I spoke with pts wife (DPR signed) pt has already gone to [facility] in [place]. Pts wife said the needle looked like one of the needles you would use to prick finger for BS ck. Pts wife was not sure if looked like used needle or not. Pts wife not sure last tetanus shot also. Sending note to Dr Ermalene Searing and Lupita Leash CMA.  ?

## 2021-09-23 NOTE — Discharge Instructions (Signed)
Follow up in 3-4 weeks with Primary Care for re-check ?

## 2021-09-23 NOTE — Telephone Encounter (Signed)
Agree with urgent care  ? Last Tdap 2017.. notify wife if not already done. ?

## 2021-09-23 NOTE — ED Triage Notes (Signed)
Pt states he was stuck accidentally today in the thumb on the right hand  ?

## 2021-09-23 NOTE — ED Provider Notes (Signed)
?[facility] ? ? ? ?CSN: [id number] ?Arrival date & time: 09/23/21  1712 ? ? ?  ? ?History   ?Chief Complaint ?Chief Complaint  ?Patient presents with  ? Body Fluid Exposure  ?  Needle stick  ? ? ?HPI ?Christopher Hatfield is a 41 y.o. male.  ? ?Patient presenting today with an accidental needlestick with a lancet when he was cleaning out a new truck that he just bought from someone.  He states that the lancet stuck him in the left thumb.  He has cleaned the area and states the bleeding is well controlled and he has no redness to the site.  He does not have any medical history on the person who brought the truck from at this time.  Last tetanus shot was in 2017. ? ? ?Past Medical History:  ?Diagnosis Date  ? Carpal tunnel syndrome of right wrist 08/2013  ? ? ?Patient Active Problem List  ? Diagnosis Date Noted  ? Attention and concentration deficit 08/07/2019  ? Obesity (BMI 30.0-34.9) 03/04/2017  ? Lateral epicondylitis of right elbow 02/11/2017  ? Chronic radicular low back pain 07/29/2014  ? Chronic left shoulder pain 06/01/2013  ? Allergic sinusitis 01/06/2010  ? Depression, major, recurrent, moderate (HCC) 09/25/2007  ? ? ?Past Surgical History:  ?Procedure Laterality Date  ? APPENDECTOMY    ? CARPAL TUNNEL RELEASE Left 07/27/2013  ? Procedure: LEFT CARPAL TUNNEL RELEASE;  Surgeon: Tami RibasKevin R Kuzma, MD;  Location: [facility];  Service: Orthopedics;  Laterality: Left;  ? CARPAL TUNNEL RELEASE Right 09/07/2013  ? Procedure: RIGHT CARPAL TUNNEL RELEASE;  Surgeon: Tami RibasKevin R Kuzma, MD;  Location: [facility];  Service: Orthopedics;  Laterality: Right;  ? LAPAROSCOPIC APPENDECTOMY N/A 07/21/2017  ? Procedure: APPENDECTOMY LAPAROSCOPIC;  Surgeon: Manus Ruddsuei, Matthew, MD;  Location: [facility];  Service: General;  Laterality: N/A;  ? TONSILLECTOMY    ? VASECTOMY    ? WISDOM TOOTH EXTRACTION    ? ? ? ? ? ?Home Medications   ? ?Prior to Admission medications   ?Medication Sig Start Date End Date Taking?  Authorizing Provider  ?elvitegravir-cobicistat-emtricitabine-tenofovir (GENVOYA) 150-150-200-10 MG TABS tablet Take 1 tablet by mouth daily with breakfast. 09/23/21  Yes Particia NearingLane, Medard Decuir Elizabeth, PA-C  ?acetaminophen (TYLENOL) 325 MG tablet You can take 2 tablets every 4 hours as needed for pain.  You can alternate with Ibuprofen, or Oxycodone.  Do not exceed 4000 mg per day. 07/22/17   Sherrie GeorgeJennings, Willard, PA-C  ?amphetamine-dextroamphetamine (ADDERALL XR) 20 MG 24 hr capsule Take 20 mg by mouth daily at 12 noon. At noon 07/18/20   [provider]  ?amphetamine-dextroamphetamine (ADDERALL) 30 MG tablet dextroamphetamine-amphetamine 30 mg tablet ? TAKE 1 TABLET BY MOUTH TWICE DAILY    [provider]  ?Aspirin-Salicylamide-Caffeine 325-95-16 MG TABS Take 2 each by mouth as needed.     [provider]  ?fluticasone (FLONASE) 50 MCG/ACT nasal spray SPRAY 2 SPRAYS INTO EACH NOSTRIL EVERY DAY 02/16/21   Bedsole, Amy E, MD  ?gabapentin (NEURONTIN) 600 MG tablet Take 2 tablets by mouth in the morning, at noon, and at bedtime.    [provider]  ?naproxen (NAPROSYN) 500 MG tablet Take one po daily x 7-10 days then prn shoulder pain, take with food 02/25/21   Eustaquio BoydenGutierrez, Javier, MD  ?omeprazole (PRILOSEC) 40 MG capsule Take 1 capsule (40 mg total) by mouth as needed. 08/18/21   Excell SeltzerBedsole, Amy E, MD  ? ? ?Family History ?Family History  ?  Problem Relation Age of Onset  ? Depression Mother   ? Deep vein thrombosis Father   ? Heart attack Maternal Grandmother   ? Diabetes Paternal Grandmother   ? Cancer Paternal Grandfather   ?     PROSTATE  ? ? ?Social History ?Social History  ? ?Tobacco Use  ? Smoking status: Former  ?  Passive exposure: Past  ? Smokeless tobacco: Former  ? Tobacco comments:  ?  quit smoking 6 years ago  ?Vaping Use  ? Vaping Use: Never used  ?Substance Use Topics  ? Alcohol use: No  ?  Alcohol/week: 0.0 standard drinks  ? Drug use: No  ? ? ? ?Allergies   ?Penicillins and Sulfonamide  derivatives ? ? ?Review of Systems ?Review of Systems ?Per HPI ? ?Physical Exam ?Triage Vital Signs ?ED Triage Vitals  ?Enc Vitals Group  ?   BP 09/23/21 1737 134/84  ?   Pulse Rate 09/23/21 1737 77  ?   Resp 09/23/21 1737 18  ?   Temp 09/23/21 1737 97.8 ?F (36.6 ?C)  ?   Temp Source 09/23/21 1737 Oral  ?   SpO2 09/23/21 1737 98 %  ?   Weight --   ?   Height --   ?   Head Circumference --   ?   Peak Flow --   ?   Pain Score 09/23/21 1738 0  ?   Pain Loc --   ?   Pain Edu? --   ?   Excl. in GC? --   ? ?No data found. ? ?Updated Vital Signs ?BP 134/84 (BP Location: Right Arm)   Pulse 77   Temp 97.8 ?F (36.6 ?C) (Oral)   Resp 18   SpO2 98%  ? ?Visual Acuity ?Right Eye Distance:   ?Left Eye Distance:   ?Bilateral Distance:   ? ?Right Eye Near:   ?Left Eye Near:    ?Bilateral Near:    ? ?Physical Exam ?Vitals and nursing note reviewed.  ?Constitutional:   ?   Appearance: Normal appearance.  ?HENT:  ?   Head: Atraumatic.  ?Eyes:  ?   Extraocular Movements: Extraocular movements intact.  ?   Conjunctiva/sclera: Conjunctivae normal.  ?Cardiovascular:  ?   Rate and Rhythm: Normal rate and regular rhythm.  ?Pulmonary:  ?   Effort: Pulmonary effort is normal.  ?   Breath sounds: Normal breath sounds.  ?Musculoskeletal:     ?   General: Normal range of motion.  ?   Cervical back: Normal range of motion and neck supple.  ?Skin: ?   General: Skin is warm and dry.  ?Neurological:  ?   General: No focal deficit present.  ?   Mental Status: He is oriented to person, place, and time.  ?Psychiatric:     ?   Mood and Affect: Mood normal.     ?   Thought Content: Thought content normal.     ?   Judgment: Judgment normal.  ? ? ? ?UC Treatments / Results  ?Labs ?(all labs ordered are listed, but only abnormal results are displayed) ?Labs Reviewed  ?RAPID HIV SCREEN (HIV 1/2 AB+AG)  ?HEPATITIS C ANTIBODY  ?HEPATITIS B SURFACE ANTIGEN  ?RPR  ? ? ?EKG ? ? ?Radiology ?No results found. ? ?Procedures ?Procedures (including critical care  time) ? ?Medications Ordered in UC ?Medications  ?Tdap (BOOSTRIX) injection 0.5 mL (0.5 mLs Intramuscular Given 09/23/21 1813)  ? ? ?Initial Impression / Assessment and Plan / UC  Course  ?I have reviewed the triage vital signs and the nursing notes. ? ?Pertinent labs & imaging results that were available during my care of the patient were reviewed by me and considered in my medical decision making (see chart for details). ? ?  ? ?Tdap updated as it has been greater than 5 years, labs drawn for body fluid exposure protocol, HIV prophylactics sent for 1 month.  Discussed to follow-up in 3 to 4 weeks with primary care provider for recheck. ? ?Final Clinical Impressions(s) / UC Diagnoses  ? ?Final diagnoses:  ?Accidental needlestick injury due to non-hypodermic needle  ? ? ? ?Discharge Instructions   ? ?  ?Follow up in 3-4 weeks with Primary Care for re-check ? ? ? ?ED Prescriptions   ? ? Medication Sig Dispense Auth. Provider  ? elvitegravir-cobicistat-emtricitabine-tenofovir (GENVOYA) 150-150-200-10 MG TABS tablet Take 1 tablet by mouth daily with breakfast. 30 tablet Particia Nearing, [place]  ? ?  ? ?PDMP not reviewed this encounter. ?  ?Particia Nearing, PA-C ?09/23/21 1835 ? ?

## 2021-09-24 NOTE — Telephone Encounter (Signed)
Patient was seen at Urgent Care and they updated his Tdap on 09/23/2021.   ?

## 2021-09-28 LAB — RPR: RPR Ser Ql: NONREACTIVE

## 2021-09-28 LAB — HEPATITIS C ANTIBODY: Hep C Virus Ab: NONREACTIVE

## 2021-09-28 LAB — HEPATITIS B SURFACE ANTIGEN: Hepatitis B Surface Ag: NEGATIVE

## 2021-09-29 LAB — SPECIMEN STATUS REPORT

## 2023-02-07 ENCOUNTER — Telehealth: Payer: Self-pay | Admitting: *Deleted

## 2023-02-07 DIAGNOSIS — Z13220 Encounter for screening for lipoid disorders: Secondary | ICD-10-CM

## 2023-02-07 NOTE — Telephone Encounter (Signed)
-----   Message from Vincenza Hews sent at 02/07/2023  2:38 PM EDT ----- Pt is coming in for labs on 8/22. Can we get lab orders please.   Thanks

## 2023-02-10 ENCOUNTER — Encounter: Payer: Self-pay | Admitting: Family Medicine

## 2023-02-17 ENCOUNTER — Other Ambulatory Visit (INDEPENDENT_AMBULATORY_CARE_PROVIDER_SITE_OTHER): Payer: Self-pay

## 2023-02-17 DIAGNOSIS — Z13220 Encounter for screening for lipoid disorders: Secondary | ICD-10-CM

## 2023-02-17 LAB — COMPREHENSIVE METABOLIC PANEL
ALT: 16 U/L (ref 0–53)
AST: 19 U/L (ref 0–37)
Albumin: 4.6 g/dL (ref 3.5–5.2)
Alkaline Phosphatase: 50 U/L (ref 39–117)
BUN: 11 mg/dL (ref 6–23)
CO2: 29 mEq/L (ref 19–32)
Calcium: 9.7 mg/dL (ref 8.4–10.5)
Chloride: 102 mEq/L (ref 96–112)
Creatinine, Ser: 0.81 mg/dL (ref 0.40–1.50)
GFR: 107.31 mL/min (ref >60.00)
Glucose, Bld: 106 mg/dL — ABNORMAL HIGH (ref 70–99)
Potassium: 4.5 mEq/L (ref 3.5–5.1)
Sodium: 138 mEq/L (ref 135–145)
Total Bilirubin: 0.5 mg/dL (ref 0.2–1.2)
Total Protein: 7 g/dL (ref 6.0–8.3)

## 2023-02-17 LAB — LIPID PANEL
Cholesterol: 184 mg/dL (ref 0–200)
HDL: 71.6 mg/dL (ref >39.00)
LDL Cholesterol: 105 mg/dL — ABNORMAL HIGH (ref 0–99)
NonHDL: 112.6
Total CHOL/HDL Ratio: 3
Triglycerides: 38 mg/dL (ref 0.0–149.0)
VLDL: 7.6 mg/dL (ref 0.0–40.0)

## 2023-02-17 NOTE — Progress Notes (Signed)
No critical labs need to be addressed urgently. We will discuss labs in detail at upcoming office visit.   

## 2023-02-24 ENCOUNTER — Ambulatory Visit (INDEPENDENT_AMBULATORY_CARE_PROVIDER_SITE_OTHER): Payer: Self-pay | Admitting: Family Medicine

## 2023-02-24 ENCOUNTER — Encounter: Payer: Self-pay | Admitting: Family Medicine

## 2023-02-24 VITALS — BP 120/76 | HR 88 | Temp 99.5°F | Ht 68.0 in | Wt 150.0 lb

## 2023-02-24 DIAGNOSIS — Z0000 Encounter for general adult medical examination without abnormal findings: Secondary | ICD-10-CM

## 2023-02-24 DIAGNOSIS — K219 Gastro-esophageal reflux disease without esophagitis: Secondary | ICD-10-CM

## 2023-02-24 DIAGNOSIS — F331 Major depressive disorder, recurrent, moderate: Secondary | ICD-10-CM

## 2023-02-24 DIAGNOSIS — R7303 Prediabetes: Secondary | ICD-10-CM

## 2023-02-24 LAB — POCT GLYCOSYLATED HEMOGLOBIN (HGB A1C): Hemoglobin A1C: 5.2 % (ref 4.0–5.6)

## 2023-02-24 MED ORDER — OMEPRAZOLE 40 MG PO CPDR: 40.0000 mg | DELAYED_RELEASE_CAPSULE | Freq: Every day | ORAL | 3 refills | Status: AC | PRN

## 2023-02-24 NOTE — Assessment & Plan Note (Addendum)
Chronic,  moderate control followed by Dr. Sharl Ma. On Adderall XR and gabapentin 1200 mg  TID. Going to counselor.

## 2023-02-24 NOTE — Addendum Note (Signed)
Addended by: Damita Lack on: 02/24/2023 11:53 AM   Modules accepted: Orders

## 2023-02-24 NOTE — Assessment & Plan Note (Signed)
Restart PPI.. prilosec 40 mg  daily.

## 2023-02-24 NOTE — Progress Notes (Signed)
Patient ID: Christopher Hatfield, male    DOB: January 05, 1981, 42 y.o.   MRN: [id number]  This visit was conducted in person.  BP 120/76 (BP Location: Right Arm, Patient Position: Sitting, Cuff Size: Normal)   Pulse 88   Temp 99.5 F (37.5 C) (Temporal)   Ht 5\' 8"  (1.727 m)   Wt 150 lb (68 kg)   SpO2 99%   BMI 22.81 kg/m    CC: Chief Complaint  Patient presents with   Annual Exam    Subjective:   HPI: Christopher Hatfield is a 42 y.o. male presenting on 02/24/2023 for Annual Exam  Chronic left shoulder pain/ back pain: going to PT. Gabapentin also helps with the back pain... had gotten this down but now increased dose to gabapentin 1200 mg TID.Marland Kitchen increased by psychiatrist in last month for recent mood issues. Followed by Dr. Shon Baton at [facility] through workers comp. No indication for injection or surgery.  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 He has split with his wife.  No drugs, no alcohol per pt.   Eating less overall and increased stress  He has lost 60 pounds in the last year Wt Readings from Last 3 Encounters:  02/24/23 150 lb (68 kg)  08/18/21 210 lb (95.3 kg)  02/25/21 217 lb (98.4 kg)   Has been having some indigestion with spicy foods or pizza.. heartburn.. taking tums.  MDD/ADD:  Recet poor control.. Followed by Dr. Sharl Ma. On Adderall XR and gabapentin 1200 mg  TID.  Last OV with Dr. Sharl Ma in last month..  separated from wife in last month.. increase in mood issues... gabapentin increased back up... tolerating pretty well with some improvement in mood but slowed thinking.  Some headache and CP when taking to wife... ie stressful situations.    Reviewed labs in detail. Lab Results  Component Value Date   CHOL 184 02/17/2023   HDL 71.60 02/17/2023   LDLCALC 105 (H) 02/17/2023   TRIG 38.0 02/17/2023   CHOLHDL 3 02/17/2023  The 10-year ASCVD risk score (Arnett DK, et al., 2019) is: 0.9%   Values used to calculate the score:     Age: 27  years     Sex: Male     Is Non-Hispanic African American: No     Diabetic: No     Tobacco smoker: No     Systolic Blood Pressure: 120 mmHg     Is BP treated: No     HDL Cholesterol: 71.6 mg/dL     Total Cholesterol: 184 mg/dL   Working low cholesterol diet  Limited exercise.  He state he sleeps well.. no recent snoring that he is aware off.        Relevant past medical, surgical, family and social history reviewed and updated as indicated. Interim medical history since our last visit reviewed. Allergies and medications reviewed and updated. Outpatient Medications Prior to Visit  Medication Sig Dispense Refill   acetaminophen (TYLENOL) 325 MG tablet Take 650 mg by mouth every 4 (four) hours as needed.     amphetamine-dextroamphetamine (ADDERALL) 20 MG tablet Take 20 mg by mouth 3 (three) times daily.     fluticasone (FLONASE) 50 MCG/ACT nasal spray Place 2 sprays into both nostrils daily as needed for allergies or rhinitis.     gabapentin (NEURONTIN) 600 MG tablet Take 2 tablets by mouth in the morning, at noon, and at bedtime.     naproxen (NAPROSYN) 500 MG tablet Take  500 mg by mouth 2 (two) times daily with a meal.     omeprazole (PRILOSEC) 40 MG capsule Take 40 mg by mouth daily as needed.     acetaminophen (TYLENOL) 325 MG tablet You can take 2 tablets every 4 hours as needed for pain.  You can alternate with Ibuprofen, or Oxycodone.  Do not exceed 4000 mg per day. (Patient taking differently: You can take 2 tablets every 4 hours as needed for pain.)     amphetamine-dextroamphetamine (ADDERALL XR) 20 MG 24 hr capsule Take 20 mg by mouth daily at 12 noon. At noon     amphetamine-dextroamphetamine (ADDERALL) 30 MG tablet dextroamphetamine-amphetamine 30 mg tablet  TAKE 1 TABLET BY MOUTH TWICE DAILY     Aspirin-Salicylamide-Caffeine 325-95-16 MG TABS Take 2 each by mouth as needed.      elvitegravir-cobicistat-emtricitabine-tenofovir (GENVOYA) 150-150-200-10 MG TABS tablet Take 1  tablet by mouth daily with breakfast. 30 tablet 0   fluticasone (FLONASE) 50 MCG/ACT nasal spray SPRAY 2 SPRAYS INTO EACH NOSTRIL EVERY DAY 48 mL 1   naproxen (NAPROSYN) 500 MG tablet Take one po daily x 7-10 days then prn shoulder pain, take with food 30 tablet 0   omeprazole (PRILOSEC) 40 MG capsule Take 1 capsule (40 mg total) by mouth as needed. 90 capsule 1   No facility-administered medications prior to visit.     Per HPI unless specifically indicated in ROS section below Review of Systems  Constitutional:  Negative for fatigue and fever.  HENT:  Negative for ear pain.   Eyes:  Negative for pain.  Respiratory:  Negative for cough and shortness of breath.   Cardiovascular:  Negative for chest pain, palpitations and leg swelling.  Gastrointestinal:  Negative for abdominal pain.  Genitourinary:  Negative for dysuria.  Musculoskeletal:  Positive for arthralgias and back pain.  Neurological:  Negative for syncope, light-headedness and headaches.  Psychiatric/Behavioral:  Negative for dysphoric mood.    Objective:  BP 120/76 (BP Location: Right Arm, Patient Position: Sitting, Cuff Size: Normal)   Pulse 88   Temp 99.5 F (37.5 C) (Temporal)   Ht 5\' 8"  (1.727 m)   Wt 150 lb (68 kg)   SpO2 99%   BMI 22.81 kg/m   Wt Readings from Last 3 Encounters:  02/24/23 150 lb (68 kg)  08/18/21 210 lb (95.3 kg)  02/25/21 217 lb (98.4 kg)      Physical Exam Constitutional:      General: He is not in acute distress.    Appearance: Normal appearance. He is well-developed. He is not ill-appearing or toxic-appearing.  HENT:     Head: Normocephalic and atraumatic.     Right Ear: Hearing, tympanic membrane, ear canal and external ear normal.     Left Ear: Hearing, tympanic membrane, ear canal and external ear normal.     Nose: Nose normal.     Mouth/Throat:     Pharynx: Uvula midline.  Eyes:     General: Lids are normal. Lids are everted, no foreign bodies appreciated.      Conjunctiva/sclera: Conjunctivae normal.     Pupils: Pupils are equal, round, and reactive to light.  Neck:     Thyroid: No thyroid mass or thyromegaly.     Vascular: No carotid bruit.     Trachea: Trachea and phonation normal.  Cardiovascular:     Rate and Rhythm: Normal rate and regular rhythm.     Pulses: Normal pulses.     Heart sounds: S1 normal and  S2 normal. No murmur heard.    No gallop.  Pulmonary:     Breath sounds: Normal breath sounds. No wheezing, rhonchi or rales.  Abdominal:     General: Bowel sounds are normal.     Palpations: Abdomen is soft.     Tenderness: There is no abdominal tenderness. There is no guarding or rebound.     Hernia: No hernia is present.  Musculoskeletal:     Cervical back: Normal range of motion and neck supple.  Lymphadenopathy:     Cervical: No cervical adenopathy.  Skin:    General: Skin is warm and dry.     Findings: No rash.  Neurological:     Mental Status: He is alert.     Cranial Nerves: No cranial nerve deficit.     Sensory: No sensory deficit.     Gait: Gait normal.     Deep Tendon Reflexes: Reflexes are normal and symmetric.  Psychiatric:        Speech: Speech normal.        Behavior: Behavior normal.        Judgment: Judgment normal.       Results for orders placed or performed in visit on 02/17/23  Lipid panel  Result Value Ref Range   Cholesterol 184 0 - 200 mg/dL   Triglycerides 84.1 0.0 - 149.0 mg/dL   HDL 32.44 >01.02 mg/dL   VLDL 7.6 0.0 - 72.5 mg/dL   LDL Cholesterol 366 (H) 0 - 99 mg/dL   Total CHOL/HDL Ratio 3    NonHDL 112.60   Comprehensive metabolic panel  Result Value Ref Range   Sodium 138 135 - 145 mEq/L   Potassium 4.5 3.5 - 5.1 mEq/L   Chloride 102 96 - 112 mEq/L   CO2 29 19 - 32 mEq/L   Glucose, Bld 106 (H) 70 - 99 mg/dL   BUN 11 6 - 23 mg/dL   Creatinine, Ser 4.40 0.40 - 1.50 mg/dL   Total Bilirubin 0.5 0.2 - 1.2 mg/dL   Alkaline Phosphatase 50 39 - 117 U/L   AST 19 0 - 37 U/L   ALT 16 0 -  53 U/L   Total Protein 7.0 6.0 - 8.3 g/dL   Albumin 4.6 3.5 - 5.2 g/dL   GFR 347.42 >59.56 mL/min   Calcium 9.7 8.4 - 10.5 mg/dL    This visit occurred during the SARS-CoV-2 public health emergency.  Safety protocols were in place, including screening questions prior to the visit, additional usage of staff PPE, and extensive cleaning of exam room while observing appropriate contact time as indicated for disinfecting solutions.   COVID 19 screen:  No recent travel or known exposure to COVID19 The patient denies respiratory symptoms of COVID 19 at this time. The importance of social distancing was discussed today.   Assessment and Plan   The patient's preventative maintenance and recommended screening tests for an annual wellness exam were reviewed in full today. Brought up to date unless services declined.  Counselled on the importance of diet, exercise, and its role in overall health and mortality. The patient's FH and SH was reviewed, including their home life, tobacco status, and drug and alcohol status.   Vaccines: Up to date except refused flu vaccine.  COVID x 2, but he does not have dates Colon: no early family history... Plan start testing next year. Prostate cancer  in dad at age early 73s No concerns in genital or rectal area. Nonsmoker. Hep C: done Problem List  Items Addressed This Visit     Depression, major, recurrent, moderate (HCC)     Chronic,  moderate control followed by Dr. Sharl Ma. On Adderall XR and gabapentin 1200 mg  TID. Going to counselor.      Gastroesophageal reflux disease without esophagitis     Restart PPI.. prilosec 40 mg  daily.      Relevant Medications   omeprazole (PRILOSEC) 40 MG capsule   Other Visit Diagnoses     Routine general medical examination at a health care facility    -  Primary   Prediabetes            Kerby Nora, MD

## 2023-04-06 ENCOUNTER — Emergency Department (HOSPITAL_COMMUNITY): Payer: Medicaid Other

## 2023-04-06 ENCOUNTER — Emergency Department (HOSPITAL_COMMUNITY)
Admission: EM | Admit: 2023-04-06 | Discharge: 2023-04-06 | Disposition: A | Discharge status: Home / Self Care | Payer: Medicaid Other | Attending: Emergency Medicine | Admitting: Emergency Medicine

## 2023-04-06 ENCOUNTER — Other Ambulatory Visit: Payer: Self-pay

## 2023-04-06 DIAGNOSIS — S61305A Unspecified open wound of left ring finger with damage to nail, initial encounter: Secondary | ICD-10-CM | POA: Insufficient documentation

## 2023-04-06 DIAGNOSIS — W312XXA Contact with powered woodworking and forming machines, initial encounter: Secondary | ICD-10-CM | POA: Insufficient documentation

## 2023-04-06 DIAGNOSIS — Z23 Encounter for immunization: Secondary | ICD-10-CM | POA: Insufficient documentation

## 2023-04-06 DIAGNOSIS — S6992XA Unspecified injury of left wrist, hand and finger(s), initial encounter: Secondary | ICD-10-CM

## 2023-04-06 MED ORDER — TETANUS-DIPHTH-ACELL PERTUSSIS 5-2.5-18.5 LF-MCG/0.5 IM SUSY
0.5000 mL | PREFILLED_SYRINGE | Freq: Once | INTRAMUSCULAR | Status: AC
  Administered 2023-04-06: 0.5 mL via INTRAMUSCULAR
  Filled 2023-04-06: qty 0.5

## 2023-04-06 MED ORDER — CLINDAMYCIN HCL 150 MG PO CAPS: 150.0000 mg | ORAL_CAPSULE | Freq: Three times a day (TID) | ORAL | 0 refills | Status: AC

## 2023-04-06 MED ORDER — OXYCODONE-ACETAMINOPHEN 5-325 MG PO TABS
1.0000 | ORAL_TABLET | Freq: Once | ORAL | Status: AC
  Administered 2023-04-06: 1 via ORAL
  Filled 2023-04-06: qty 1

## 2023-04-06 MED ORDER — HYDROCODONE-ACETAMINOPHEN 5-325 MG PO TABS: 1.0000 | ORAL_TABLET | Freq: Four times a day (QID) | ORAL | 0 refills | Status: DC | PRN

## 2023-04-06 MED ORDER — CLINDAMYCIN HCL 150 MG PO CAPS
150.0000 mg | ORAL_CAPSULE | Freq: Once | ORAL | Status: AC
  Administered 2023-04-06: 150 mg via ORAL
  Filled 2023-04-06: qty 1

## 2023-04-06 MED ORDER — BUPIVACAINE HCL (PF) 0.5 % IJ SOLN
10.0000 mL | Freq: Once | INTRAMUSCULAR | Status: AC
  Administered 2023-04-06: 10 mL
  Filled 2023-04-06: qty 30

## 2023-04-06 NOTE — ED Triage Notes (Signed)
C/o left ring finger pain after getting caught in skill saw 2 hrs PTA.  Left radial pulse noted Tetanus UTD per patient.  Bleeding controlled. Denies blood thinner usage.

## 2023-04-06 NOTE — ED Provider Notes (Signed)
[place] EMERGENCY DEPARTMENT AT [facility] Provider Note   CSN: [id number] Arrival date & time: 04/06/23  1616     History  Chief Complaint  Patient presents with   Finger Injury    Christopher Hatfield is a 42 y.o. male.  Patient with noncontributory past medical history presents today with complaints of left ring finger injury.  He states that same occurred immediately prior to arrival today when he cut his finger with a circular saw.  He endorses significant pain to same.  Denies any other injuries or complaints.  He is unsure of his last tetanus.  The history is provided by the patient. No language interpreter was used.       Home Medications Prior to Admission medications   Medication Sig Start Date End Date Taking? Authorizing Provider  acetaminophen (TYLENOL) 325 MG tablet Take 650 mg by mouth every 4 (four) hours as needed.    [provider]  amphetamine-dextroamphetamine (ADDERALL) 20 MG tablet Take 20 mg by mouth 3 (three) times daily. 02/03/23   [provider]  fluticasone (FLONASE) 50 MCG/ACT nasal spray Place 2 sprays into both nostrils daily as needed for allergies or rhinitis.    [provider]  gabapentin (NEURONTIN) 600 MG tablet Take 2 tablets by mouth in the morning, at noon, and at bedtime.    [provider]  omeprazole (PRILOSEC) 40 MG capsule Take 1 capsule (40 mg total) by mouth daily as needed. 02/24/23   Excell Seltzer, MD      Allergies    Penicillins and Sulfonamide derivatives    Review of Systems   Review of Systems  Skin:  Positive for wound.  All other systems reviewed and are negative.   Physical Exam Updated Vital Signs BP (!) 139/101   Pulse 88   Temp 98 F (36.7 C)   Resp 20   Wt 68 kg   SpO2 100%   BMI 22.81 kg/m  Physical Exam Vitals and nursing note reviewed.  Constitutional:      General: He is not in acute distress.    Appearance: Normal appearance. He is normal weight. He  is not ill-appearing, toxic-appearing or diaphoretic.  HENT:     Head: Normocephalic and atraumatic.  Cardiovascular:     Rate and Rhythm: Normal rate.  Pulmonary:     Effort: Pulmonary effort is normal. No respiratory distress.  Musculoskeletal:        General: Normal range of motion.     Cervical back: Normal range of motion.     Comments: Wound noted to the anterior left ring finger.  Nailbed involvement.  Bleeding controlled.  Distal pulses and sensation intact.  ROM intact with some discomfort.  See images below for further  Skin:    General: Skin is warm and dry.  Neurological:     General: No focal deficit present.     Mental Status: He is alert.  Psychiatric:        Mood and Affect: Mood normal.        Behavior: Behavior normal.     ED Results / Procedures / Treatments   Labs (all labs ordered are listed, but only abnormal results are displayed) Labs Reviewed - No data to display  EKG None  Radiology DG Hand Complete Left  Result Date: 04/06/2023 CLINICAL DATA:  Left ring finger caught in saw.  Left hand injury. EXAM: LEFT HAND - COMPLETE 3+ VIEW COMPARISON:  None Available. FINDINGS: There is  attenuation and irregularity of the soft tissues at the distal medial aspect of the fourth finger consistent with traumatic injury. There is bone loss of the adjacent distal medial aspect of the distal phalanx of the fourth finger and region measuring up to approximately 4 mm in longitudinal length and 2 mm in depth. No intra-articular extension. No dislocation. Moderate distal fourth finger soft tissue swelling. IMPRESSION: Traumatic injury of the distal medial aspect of the fourth finger with mild bone loss of the adjacent distal medial aspect of the distal phalanx. Electronically Signed   By: Neita Garnet M.D.   On: 04/06/2023 18:20    Procedures .Nerve Block  Date/Time: 04/06/2023 8:20 PM  Performed by: Silva Bandy, PA-C Authorized by: Silva Bandy, PA-C   Consent:     Consent obtained:  Verbal   Consent given by:  Patient   Risks, benefits, and alternatives were discussed: yes     Risks discussed:  Allergic reaction, infection, nerve damage, swelling, unsuccessful block, pain, intravenous injection and bleeding   Alternatives discussed:  No treatment, delayed treatment, alternative treatment and referral Universal protocol:    Procedure explained and questions answered to patient or proxy's satisfaction: yes     Imaging studies available: yes     Patient identity confirmed:  Verbally with patient Location:    Body area:  Upper extremity   Upper extremity nerve:  Metacarpal   Laterality:  Left Pre-procedure details:    Skin preparation:  Alcohol Skin anesthesia:    Skin anesthesia method:  Local infiltration   Local anesthetic:  Bupivacaine 0.5% w/o epi Procedure details:    Block needle gauge:  25 G   Anesthetic injected:  Bupivacaine 0.5% w/o epi   Injection procedure:  Anatomic landmarks identified, incremental injection, negative aspiration for blood, anatomic landmarks palpated and introduced needle Post-procedure details:    Outcome:  Anesthesia achieved   Procedure completion:  Tolerated well, no immediate complications     Medications Ordered in ED Medications  Tdap (BOOSTRIX) injection 0.5 mL (0.5 mLs Intramuscular Given 04/06/23 1728)  bupivacaine(PF) (MARCAINE) 0.5 % injection 10 mL (10 mLs Infiltration Given 04/06/23 1728)  oxyCODONE-acetaminophen (PERCOCET/ROXICET) 5-325 MG per tablet 1 tablet (1 tablet Oral Given 04/06/23 1804)  clindamycin (CLEOCIN) capsule 150 mg (150 mg Oral Given 04/06/23 1923)    ED Course/ Medical Decision Making/ A&P                                 Medical Decision Making Amount and/or Complexity of Data Reviewed Radiology: ordered.  Risk Prescription drug management.   This patient is a 41 y.o. male  who presents to the ED for concern of left ring finger injury.   Differential diagnoses prior to  evaluation: The emergent differential diagnosis includes, but is not limited to,  trauma . This is not an exhaustive differential.   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  Additional history: Chart reviewed.  Physical Exam: Physical exam performed. The pertinent findings include: see images above for further  Lab Tests/Imaging studies: I personally interpreted labs/imaging and the pertinent results include:  dg left hand  Traumatic injury of the distal medial aspect of the fourth finger with mild bone loss of the adjacent distal medial aspect of the distal phalanx.   I agree with the radiologist interpretation.   Medications:  I ordered medication including oxycodone for pain.  I have reviewed the patients home medicines and have made adjustments as needed.  Consultations Obtained: I requested consultation with the hand surgery on call Dr. Romeo Apple,  and discussed lab and imaging findings as well as pertinent plan - they recommend: clean the wound and remove the excess skin. Apply sterile dressing and discharge with antibiotics and outpatient follow-up.    Disposition: After consideration of the diagnostic results and the patients response to treatment, I feel that emergency department workup does not suggest an emergent condition requiring admission or immediate intervention beyond what has been performed at this time. The plan is: discharge with oral pain medications and antibiotics with hand surgery follow-up.  His wound has been cleaned and debrided with nerve block per above procedure which was well-tolerated.  Tetanus updated.  PDMP reviewed.  Patient advised not to drive or operate heavy machinery while taking pain medication.  He does have a allergy to cephalosporins, therefore will treat with clindamycin.  First dose given today.  Referral to hand surgery given as well.  Given  fracture, patient placed in a finger splint as well. Evaluation and diagnostic testing in the emergency department does not suggest an emergent condition requiring admission or immediate intervention beyond what has been performed at this time.  Plan for discharge with close PCP follow-up.  Patient is understanding and amenable with plan, educated on red flag symptoms that would prompt immediate return.  Patient discharged in stable condition.  Findings and plan of care discussed with supervising physician Dr. Hyacinth Meeker who is in agreement.   Final Clinical Impression(s) / ED Diagnoses Final diagnoses:  Injury of left ring finger, initial encounter    Rx / DC Orders ED Discharge Orders          Ordered    HYDROcodone-acetaminophen (NORCO/VICODIN) 5-325 MG tablet  Every 6 hours PRN        04/06/23 2029    clindamycin (CLEOCIN) 150 MG capsule  3 times daily        04/06/23 2029          An After Visit Summary was printed and given to the patient.     Vear Clock 04/06/23 2031    Eber Hong, MD 04/07/23 [phone]

## 2023-04-06 NOTE — ED Notes (Signed)
Edp in room  

## 2023-04-06 NOTE — Discharge Instructions (Signed)
As we discussed, your finger wound was cleaned and debrided in the emergency department today.  We have applied a dressing over the area.  I have given you a prescription for clindamycin which is an antibiotic and you take as prescribed in its entirety to help prevent infection given that you have an underlying fracture in the bone.  We have placed you in a splint that you need to wear at all times.  I have also given you a prescription for Vicodin which is a narcotic pain medication for you to take as prescribed as needed for severe pain only.  Do not drive or operate heavy machinery while taking this medication as it can be sedating.  I have also given you a referral to a hand specialist with a number to call to schedule an appointment for follow-up.  I have discussed your case with him and he is expecting your call.  Please call at your earliest convenience.  Return if development of any new or worsening symptoms.

## 2023-04-11 ENCOUNTER — Ambulatory Visit (INDEPENDENT_AMBULATORY_CARE_PROVIDER_SITE_OTHER): Payer: Self-pay | Admitting: Orthopedic Surgery

## 2023-04-11 ENCOUNTER — Telehealth: Payer: Self-pay

## 2023-04-11 ENCOUNTER — Encounter: Payer: Self-pay | Admitting: Orthopedic Surgery

## 2023-04-11 VITALS — BP 129/89 | HR 61 | Ht 68.0 in | Wt 160.0 lb

## 2023-04-11 DIAGNOSIS — S68115A Complete traumatic metacarpophalangeal amputation of left ring finger, initial encounter: Secondary | ICD-10-CM

## 2023-04-11 DIAGNOSIS — S68119A Complete traumatic metacarpophalangeal amputation of unspecified finger, initial encounter: Secondary | ICD-10-CM

## 2023-04-11 DIAGNOSIS — S62635B Displaced fracture of distal phalanx of left ring finger, initial encounter for open fracture: Secondary | ICD-10-CM

## 2023-04-11 MED ORDER — HYDROCODONE-ACETAMINOPHEN 5-325 MG PO TABS
1.0000 | ORAL_TABLET | Freq: Four times a day (QID) | ORAL | 0 refills | Status: DC | PRN
Start: 2023-04-11 — End: 2023-08-09

## 2023-04-11 NOTE — Progress Notes (Signed)
Emergency room follow-up  Chief Complaint  Patient presents with   Hand Pain    Left hand laceration, ring finger, DOI 04-06-23.    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   Fingertip amputation, initial encounter Yes   Displaced fracture of distal phalanx of left ring finger, initial encounter for open fracture    Problem list, medical hx, medications and allergies reviewed    Meds ordered this encounter  Medications   HYDROcodone-acetaminophen (NORCO/VICODIN) 5-325 MG tablet    Sig: Take 1 tablet by mouth every 6 (six) hours as needed for severe pain.    Dispense:  6 tablet    Refill:  0

## 2023-04-11 NOTE — Telephone Encounter (Signed)
Per chart review tab pt was seen [place] ortho surgeon 04/11/23.Marland Kitchen  pt was seen [facility] on 04/06/23 with fingertip amputation and displaced fx of distal phalanx left ring finger.sending FYI to Dr Ermalene Searing as PCP.

## 2023-04-12 NOTE — Telephone Encounter (Signed)
Noted  

## 2023-04-18 ENCOUNTER — Encounter: Payer: Self-pay | Admitting: Orthopedic Surgery

## 2023-04-18 ENCOUNTER — Ambulatory Visit (INDEPENDENT_AMBULATORY_CARE_PROVIDER_SITE_OTHER): Payer: Self-pay | Admitting: Orthopedic Surgery

## 2023-04-18 DIAGNOSIS — S62635B Displaced fracture of distal phalanx of left ring finger, initial encounter for open fracture: Secondary | ICD-10-CM

## 2023-04-18 DIAGNOSIS — S68119D Complete traumatic metacarpophalangeal amputation of unspecified finger, subsequent encounter: Secondary | ICD-10-CM

## 2023-04-18 NOTE — Progress Notes (Signed)
Follow-up fingertip amputation  Encounter Diagnoses  Name Primary?   Displaced fracture of distal phalanx of left ring finger, initial encounter for open fracture Yes   Fingertip amputation, subsequent encounter     Chief Complaint  Patient presents with   Finger Injury    04/06/23 fingertip amputation improving    Evaluation left ring finger Doing well good granulation tissue  No signs of infection  Band-Aids now use Neosporin follow-up 3 weeks

## 2023-05-09 ENCOUNTER — Ambulatory Visit: Payer: Self-pay | Admitting: Orthopedic Surgery

## 2023-05-16 ENCOUNTER — Ambulatory Visit: Payer: Medicaid Other | Admitting: Orthopedic Surgery

## 2023-08-09 ENCOUNTER — Ambulatory Visit (INDEPENDENT_AMBULATORY_CARE_PROVIDER_SITE_OTHER): Payer: Medicaid Other | Admitting: Family Medicine

## 2023-08-09 ENCOUNTER — Encounter: Payer: Self-pay | Admitting: Family Medicine

## 2023-08-09 VITALS — BP 110/70 | HR 73 | Temp 99.0°F | Ht 68.0 in | Wt 168.0 lb

## 2023-08-09 DIAGNOSIS — M7918 Myalgia, other site: Secondary | ICD-10-CM | POA: Diagnosis not present

## 2023-08-09 MED ORDER — MELOXICAM 15 MG PO TABS: 15.0000 mg | ORAL_TABLET | Freq: Every day | ORAL | 0 refills | Status: DC

## 2023-08-09 NOTE — Assessment & Plan Note (Signed)
Chronic, reviewed past MRI and Ortho notes in detail with pt.  Offered second opinion referral to [facility] but I am doubtful there would be change in recommendations.  Encouraged continued home PT, core strengthening.  ON max gabapentin mainly for mood.  Will try daily meloxicam trial..reviewed SE profile.
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
## 2023-09-05 ENCOUNTER — Other Ambulatory Visit: Payer: Self-pay | Admitting: Family Medicine

## 2023-09-05 NOTE — Telephone Encounter (Signed)
 Last filled 08-09-23 #30 Last OV 08-09-23 No Future OV  [facility] [place]

## 2023-10-10 ENCOUNTER — Other Ambulatory Visit: Payer: Self-pay | Admitting: Family Medicine

## 2023-11-10 ENCOUNTER — Other Ambulatory Visit: Payer: Self-pay | Admitting: Family Medicine

## 2023-11-10 NOTE — Telephone Encounter (Signed)
 Last office visit 08/09/23 for back pain.  Last refilled 10/11/23 for #30 with no refills. Next Appt: No future appointments.

## 2023-12-14 ENCOUNTER — Other Ambulatory Visit: Payer: Self-pay | Admitting: Family Medicine

## 2024-01-24 ENCOUNTER — Other Ambulatory Visit: Payer: Self-pay | Admitting: Family Medicine

## 2024-01-24 MED ORDER — MELOXICAM 15 MG PO TABS: ORAL_TABLET | ORAL | 0 refills | Status: DC

## 2024-01-24 NOTE — Telephone Encounter (Unsigned)
 Copied from CRM [id number]. Topic: Clinical - Medication Refill >> Jan 24, 2024 10:30 AM Burnard DEL wrote: Medication: meloxicam  (MOBIC ) 15 MG tablet  Has the patient contacted their pharmacy? No (Agent: If no, request that the patient contact the pharmacy for the refill. If patient does not wish to contact the pharmacy document the reason why and proceed with request.) (Agent: If yes, when and what did the pharmacy advise?)  This is the patient's preferred pharmacy:  [id number] - [place], [place] - [address] AT SEC OF [address] [place] [address] Phone: [phone] Fax: [phone]  Is this the correct pharmacy for this prescription? Yes If no, delete pharmacy and type the correct one.   Has the prescription been filled recently? No  Is the patient out of the medication? Yes  Has the patient been seen for an appointment in the last year OR does the patient have an upcoming appointment? Yes  Can we respond through MyChart? Yes  Agent: Please be advised that Rx refills may take up to 3 business days. We ask that you follow-up with your pharmacy.

## 2024-01-24 NOTE — Telephone Encounter (Signed)
 Last office visit 08/09/2023 for Back Pain.  Last refilled 12/26/2023 for #30 with no refills.  Next appt: No future appointments.

## 2024-02-20 ENCOUNTER — Other Ambulatory Visit: Payer: Self-pay | Admitting: Family Medicine

## 2024-02-20 NOTE — Telephone Encounter (Signed)
 Last office visit 08/09/23 for myofascial pain syndrome of lumbar spine.  Last refilled 01/24/24 for #30 with no refills.  Next Appt: No future appointments.

## 2024-03-28 ENCOUNTER — Other Ambulatory Visit: Payer: Self-pay | Admitting: Family Medicine

## 2024-03-28 NOTE — Telephone Encounter (Signed)
 Last office visit 08/09/23 for back pain.  Last refilled 02/21/24 for #30 with no refills.  Next Appt: No future appointments.    Please schedule CPE with fasting labs prior with Dr. Avelina.

## 2024-03-28 NOTE — Telephone Encounter (Signed)
 I spoked with patient and she has been already scheduled.

## 2024-03-29 ENCOUNTER — Telehealth: Payer: Self-pay | Admitting: *Deleted

## 2024-03-29 DIAGNOSIS — Z13220 Encounter for screening for lipoid disorders: Secondary | ICD-10-CM

## 2024-03-29 NOTE — Telephone Encounter (Signed)
-----   Message from Christopher Hatfield sent at 03/29/2024  1:46 PM EDT ----- Regarding: Lab Fri 04/06/24 Hello,  Patient is coming in for CPE labs on Friday 04/06/24. Can we get orders please.   Thanks

## 2024-04-06 ENCOUNTER — Ambulatory Visit: Payer: Self-pay | Admitting: Family Medicine

## 2024-04-06 ENCOUNTER — Other Ambulatory Visit (INDEPENDENT_AMBULATORY_CARE_PROVIDER_SITE_OTHER)

## 2024-04-06 DIAGNOSIS — Z13220 Encounter for screening for lipoid disorders: Secondary | ICD-10-CM

## 2024-04-06 LAB — COMPREHENSIVE METABOLIC PANEL WITH GFR
ALT: 15 U/L (ref 0–53)
AST: 17 U/L (ref 0–37)
Albumin: 4.4 g/dL (ref 3.5–5.2)
Alkaline Phosphatase: 52 U/L (ref 39–117)
BUN: 17 mg/dL (ref 6–23)
CO2: 28 meq/L (ref 19–32)
Calcium: 8.9 mg/dL (ref 8.4–10.5)
Chloride: 104 meq/L (ref 96–112)
Creatinine, Ser: 0.74 mg/dL (ref 0.40–1.50)
GFR: 109.4 mL/min (ref >60.00)
Glucose, Bld: 120 mg/dL — ABNORMAL HIGH (ref 70–99)
Potassium: 4.7 meq/L (ref 3.5–5.1)
Sodium: 138 meq/L (ref 135–145)
Total Bilirubin: 0.4 mg/dL (ref 0.2–1.2)
Total Protein: 6.4 g/dL (ref 6.0–8.3)

## 2024-04-06 LAB — LIPID PANEL
Cholesterol: 129 mg/dL (ref 0–200)
HDL: 68.4 mg/dL (ref >39.00)
LDL Cholesterol: 54 mg/dL (ref 0–99)
NonHDL: 60.13
Total CHOL/HDL Ratio: 2
Triglycerides: 31 mg/dL (ref 0.0–149.0)
VLDL: 6.2 mg/dL (ref 0.0–40.0)

## 2024-04-06 NOTE — Progress Notes (Signed)
 No critical labs need to be addressed urgently. We will discuss labs in detail at upcoming office visit.

## 2024-04-13 ENCOUNTER — Encounter: Payer: Self-pay | Admitting: Family Medicine

## 2024-04-13 ENCOUNTER — Ambulatory Visit (INDEPENDENT_AMBULATORY_CARE_PROVIDER_SITE_OTHER): Admitting: Family Medicine

## 2024-04-13 VITALS — BP 110/70 | HR 57 | Temp 98.4°F | Ht 68.25 in | Wt 164.0 lb

## 2024-04-13 DIAGNOSIS — F331 Major depressive disorder, recurrent, moderate: Secondary | ICD-10-CM | POA: Diagnosis not present

## 2024-04-13 DIAGNOSIS — R7303 Prediabetes: Secondary | ICD-10-CM | POA: Diagnosis not present

## 2024-04-13 DIAGNOSIS — R41840 Attention and concentration deficit: Secondary | ICD-10-CM

## 2024-04-13 DIAGNOSIS — Z0000 Encounter for general adult medical examination without abnormal findings: Secondary | ICD-10-CM

## 2024-04-13 LAB — POCT GLYCOSYLATED HEMOGLOBIN (HGB A1C): Hemoglobin A1C: 5.2 % (ref 4.0–5.6)

## 2024-04-13 NOTE — Assessment & Plan Note (Addendum)
 Chronic,  poor control Followed by Dr. Faythe. On Adderall XR and gabapentin  1200 mg  TID.SABRA. but planning on weaning off as he does not feel it helps. Has seen counselor in past.. feels it does not help.  He is not interested in trying new medication. Occ suicidal thought, no plan. Encouraged follow up with psychiatrist.
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
## 2024-04-13 NOTE — Assessment & Plan Note (Signed)
[id number] in office.

## 2024-04-13 NOTE — Assessment & Plan Note (Signed)
 Considering weaning off Adderall.. encouarged him to discuss it with psychiatry.

## 2024-04-27 ENCOUNTER — Other Ambulatory Visit: Payer: Self-pay | Admitting: Family Medicine
